# Patient Record
Sex: Female | Born: 1995 | Race: White | Hispanic: No | Marital: Single | Smoking: Never smoker
Health system: Southern US, Community
[De-identification: ages and names within clinical notes are randomized; demographics above are authoritative.]

## PROBLEM LIST (undated history)

## (undated) DIAGNOSIS — F329 Major depressive disorder, single episode, unspecified: Secondary | ICD-10-CM

## (undated) DIAGNOSIS — R519 Headache, unspecified: Secondary | ICD-10-CM

## (undated) DIAGNOSIS — F32A Depression, unspecified: Secondary | ICD-10-CM

## (undated) DIAGNOSIS — R112 Nausea with vomiting, unspecified: Secondary | ICD-10-CM

## (undated) DIAGNOSIS — Z9889 Other specified postprocedural states: Secondary | ICD-10-CM

## (undated) DIAGNOSIS — G4730 Sleep apnea, unspecified: Secondary | ICD-10-CM

## (undated) DIAGNOSIS — O24419 Gestational diabetes mellitus in pregnancy, unspecified control: Secondary | ICD-10-CM

## (undated) DIAGNOSIS — F419 Anxiety disorder, unspecified: Secondary | ICD-10-CM

## (undated) DIAGNOSIS — N12 Tubulo-interstitial nephritis, not specified as acute or chronic: Secondary | ICD-10-CM

## (undated) HISTORY — PX: TONSILLECTOMY: SUR1361

## (undated) HISTORY — PX: ADENOIDECTOMY: SUR15

## (undated) HISTORY — DX: Gestational diabetes mellitus in pregnancy, unspecified control: O24.419

---
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
## 2018-11-16 NOTE — L&D Delivery Note (Signed)
Delivery Note Patient pushed well for 1 hr 20 minutes.  At 5:12 PM a viable female was delivered via Vaginal, Spontaneous (Presentation: Left Occiput Anterior).  APGAR: 8, 9; weight 3625 g (7 lb 15.9 oz)  Placenta status: Spontaneous, Intact.  Cord: 3 vessels with the following complications: None.  Cord pH: n/a  Anesthesia: Epidural Episiotomy: None Lacerations: Vaginal / hymeneal tear Suture Repair: 2.0 vicryl rapide Est. Blood Loss (mL): 373  Mom to postpartum.  Baby to Couplet care / Skin to Skin.  [place] 10/28/2019, 5:41 PM

## 2018-11-20 ENCOUNTER — Encounter (HOSPITAL_COMMUNITY): Payer: Self-pay | Admitting: Emergency Medicine

## 2018-11-20 ENCOUNTER — Ambulatory Visit (HOSPITAL_COMMUNITY)
Admission: EM | Admit: 2018-11-20 | Discharge: 2018-11-20 | Disposition: A | Discharge status: Home / Self Care | Payer: Self-pay | Attending: Family Medicine | Admitting: Family Medicine

## 2018-11-20 DIAGNOSIS — N3001 Acute cystitis with hematuria: Secondary | ICD-10-CM | POA: Insufficient documentation

## 2018-11-20 DIAGNOSIS — R109 Unspecified abdominal pain: Secondary | ICD-10-CM | POA: Insufficient documentation

## 2018-11-20 LAB — POCT URINALYSIS DIP (DEVICE)
Bilirubin Urine: NEGATIVE
Glucose, UA: NEGATIVE mg/dL
Ketones, ur: NEGATIVE mg/dL
NITRITE: NEGATIVE
PH: 7.5 (ref 5.0–8.0)
Protein, ur: 30 mg/dL — AB
Specific Gravity, Urine: 1.02 (ref 1.005–1.030)
UROBILINOGEN UA: 1 mg/dL (ref 0.0–1.0)

## 2018-11-20 LAB — POCT PREGNANCY, URINE: Preg Test, Ur: NEGATIVE

## 2018-11-20 MED ORDER — CIPROFLOXACIN HCL 500 MG PO TABS: 500.0000 mg | ORAL_TABLET | Freq: Two times a day (BID) | ORAL | 0 refills | Status: DC

## 2018-11-20 NOTE — ED Triage Notes (Signed)
Pt states "im pretty sure I had a UTI last week, yesterday and today i've had a fever on and off and i've had pain in my side" pt c/o R flank pain.

## 2018-11-20 NOTE — ED Provider Notes (Signed)
MRN: [id number] DOB: 27-Feb-1996  Subjective:   Carrie Dennis is a 23 y.o. female presenting for 1 week  history of dysuria, urinary frequency, flank pain and cloudy malordorous urine.  Has also had intermittent fever and general malaise, mild hematuria.  Patient is worried about an UTI.   No current facility-administered medications for this encounter.   Current Outpatient Medications:  .  albuterol (PROVENTIL HFA;VENTOLIN HFA) 108 (90 BASE) MCG/ACT inhaler, Inhale 2 puffs into the lungs every 4 (four) hours as needed for wheezing., Disp: 3.7 g, Rfl: 0 .  citalopram (CELEXA) 10 MG tablet, Take 10 mg by mouth daily., Disp: , Rfl:  .  etonogestrel (NEXPLANON) 68 MG IMPL implant, Inject 1 each into the skin once. *implanted birth control, Disp: , Rfl:  .  HYDROcodone-homatropine (HYCODAN) 5-1.5 MG/5ML syrup, Take 5 mLs by mouth every 6 (six) hours as needed for cough. (Patient not taking: Reported on 11/20/2018), Disp: 120 mL, Rfl: 0 .  ibuprofen (ADVIL,MOTRIN) 200 MG tablet, Take 400 mg by mouth every 6 (six) hours as needed. For headaches, Disp: , Rfl:  .  ibuprofen (ADVIL,MOTRIN) 600 MG tablet, Take 1 tablet (600 mg total) by mouth every 6 (six) hours as needed for pain., Disp: 30 tablet, Rfl: 0   Allergies  Allergen Reactions  . Benadryl [Diphenhydramine Hcl] Anaphylaxis  . Penicillins Anaphylaxis    *says only antibiotic agent she can safely take is erythromycin  . Sulfa Antibiotics Anaphylaxis    History reviewed. No pertinent past medical history.   Past Surgical History:  Procedure Laterality Date  . ADENOIDECTOMY    . TONSILLECTOMY      Objective:   Vitals: BP 114/70   Pulse 97   Temp 98 F (36.7 C) (Temporal)   Resp 16   SpO2 100%   Physical Exam Constitutional:      General: She is not in acute distress.    Appearance: Normal appearance. She is well-developed. She is not ill-appearing, toxic-appearing or diaphoretic.  HENT:     Head: Normocephalic and  atraumatic.     Nose: Nose normal.     Mouth/Throat:     Mouth: Mucous membranes are moist.  Eyes:     Extraocular Movements: Extraocular movements intact.     Pupils: Pupils are equal, round, and reactive to light.  Cardiovascular:     Rate and Rhythm: Normal rate and regular rhythm.     Pulses: Normal pulses.     Heart sounds: Normal heart sounds. No murmur. No friction rub. No gallop.   Pulmonary:     Effort: Pulmonary effort is normal. No respiratory distress.     Breath sounds: Normal breath sounds. No stridor. No wheezing, rhonchi or rales.  Skin:    General: Skin is warm and dry.     Findings: No rash.  Neurological:     Mental Status: She is alert and oriented to person, place, and time.  Psychiatric:        Mood and Affect: Mood normal.        Behavior: Behavior normal.        Thought Content: Thought content normal.     Results for orders placed or performed during the hospital encounter of 11/20/18 (from the past 24 hour(s))  POCT urinalysis dip (device)     Status: Abnormal   Collection Time: 11/20/18  6:25 PM  Result Value Ref Range   Glucose, UA NEGATIVE NEGATIVE mg/dL   Bilirubin Urine NEGATIVE NEGATIVE   Ketones, ur NEGATIVE  NEGATIVE mg/dL   Specific Gravity, Urine 1.020 1.005 - 1.030   Hgb urine dipstick SMALL (A) NEGATIVE   pH 7.5 5.0 - 8.0   Protein, ur 30 (A) NEGATIVE mg/dL   Urobilinogen, UA 1.0 0.0 - 1.0 mg/dL   Nitrite NEGATIVE NEGATIVE   Leukocytes, UA LARGE (A) NEGATIVE  Pregnancy, urine POC     Status: None   Collection Time: 11/20/18  6:31 PM  Result Value Ref Range   Preg Test, Ur NEGATIVE NEGATIVE    Assessment and Plan :   Acute cystitis with hematuria  Right flank pain  Patient had right-sided CVA tenderness and I counseled that this could be a sign of acute pyelonephritis but given her significant and severe allergies she refused injection of ceftriaxone.  I did counsel her that the cross-reactivity with cephalosporins and  penicillins has not really been shown to be true.  She was agreeable to using ciprofloxacin, urine culture pending.  Strict ER return to clinic precautions reviewed.  In the meantime patient will hydrate aggressively.   Wallis Bamberg, [place] 11/20/18 1905

## 2018-11-22 ENCOUNTER — Telehealth (HOSPITAL_COMMUNITY): Payer: Self-pay | Admitting: Emergency Medicine

## 2018-11-22 NOTE — Telephone Encounter (Signed)
Attempted to return patients voicemail. No answer. Left Message to return call.

## 2018-11-23 ENCOUNTER — Telehealth (HOSPITAL_COMMUNITY): Payer: Self-pay | Admitting: Emergency Medicine

## 2018-11-23 LAB — URINE CULTURE

## 2018-11-23 NOTE — Telephone Encounter (Signed)
Urine culture was positive for  E coli and was given  cipro at urgent care visit. Attempted to reach patient, patient answered and hung up after I stated who I was.

## 2019-04-17 LAB — OB RESULTS CONSOLE GC/CHLAMYDIA
Chlamydia: NEGATIVE
Gonorrhea: NEGATIVE

## 2019-04-17 LAB — OB RESULTS CONSOLE HEPATITIS B SURFACE ANTIGEN: Hepatitis B Surface Ag: NEGATIVE

## 2019-04-17 LAB — OB RESULTS CONSOLE ABO/RH: RH Type: POSITIVE

## 2019-04-17 LAB — OB RESULTS CONSOLE RPR: RPR: NONREACTIVE

## 2019-04-17 LAB — OB RESULTS CONSOLE RUBELLA ANTIBODY, IGM: Rubella: IMMUNE

## 2019-04-17 LAB — OB RESULTS CONSOLE HIV ANTIBODY (ROUTINE TESTING): HIV: NONREACTIVE

## 2019-04-17 LAB — OB RESULTS CONSOLE ANTIBODY SCREEN: Antibody Screen: NEGATIVE

## 2019-09-22 ENCOUNTER — Inpatient Hospital Stay (HOSPITAL_COMMUNITY)
Admission: AD | Admit: 2019-09-22 | Discharge: 2019-09-22 | Disposition: A | Discharge status: Home / Self Care | Payer: Medicaid Other | Attending: Obstetrics and Gynecology | Admitting: Obstetrics and Gynecology

## 2019-09-22 ENCOUNTER — Encounter (HOSPITAL_COMMUNITY): Payer: Self-pay

## 2019-09-22 ENCOUNTER — Other Ambulatory Visit: Payer: Self-pay

## 2019-09-22 DIAGNOSIS — F329 Major depressive disorder, single episode, unspecified: Secondary | ICD-10-CM | POA: Diagnosis not present

## 2019-09-22 DIAGNOSIS — O26893 Other specified pregnancy related conditions, third trimester: Secondary | ICD-10-CM | POA: Insufficient documentation

## 2019-09-22 DIAGNOSIS — O99343 Other mental disorders complicating pregnancy, third trimester: Secondary | ICD-10-CM | POA: Insufficient documentation

## 2019-09-22 DIAGNOSIS — Z79899 Other long term (current) drug therapy: Secondary | ICD-10-CM | POA: Diagnosis not present

## 2019-09-22 DIAGNOSIS — R109 Unspecified abdominal pain: Secondary | ICD-10-CM | POA: Insufficient documentation

## 2019-09-22 DIAGNOSIS — M549 Dorsalgia, unspecified: Secondary | ICD-10-CM | POA: Insufficient documentation

## 2019-09-22 DIAGNOSIS — Z3A35 35 weeks gestation of pregnancy: Secondary | ICD-10-CM | POA: Insufficient documentation

## 2019-09-22 DIAGNOSIS — F419 Anxiety disorder, unspecified: Secondary | ICD-10-CM | POA: Diagnosis not present

## 2019-09-22 DIAGNOSIS — O4703 False labor before 37 completed weeks of gestation, third trimester: Secondary | ICD-10-CM | POA: Insufficient documentation

## 2019-09-22 DIAGNOSIS — O479 False labor, unspecified: Secondary | ICD-10-CM

## 2019-09-22 HISTORY — DX: Other specified postprocedural states: Z98.890

## 2019-09-22 HISTORY — DX: Anxiety disorder, unspecified: F41.9

## 2019-09-22 HISTORY — DX: Sleep apnea, unspecified: G47.30

## 2019-09-22 HISTORY — DX: Depression, unspecified: F32.A

## 2019-09-22 HISTORY — DX: Nausea with vomiting, unspecified: R11.2

## 2019-09-22 HISTORY — DX: Headache, unspecified: R51.9

## 2019-09-22 LAB — URINALYSIS, ROUTINE W REFLEX MICROSCOPIC
Bilirubin Urine: NEGATIVE
Glucose, UA: NEGATIVE mg/dL
Hgb urine dipstick: NEGATIVE
Ketones, ur: NEGATIVE mg/dL
Leukocytes,Ua: NEGATIVE
Nitrite: NEGATIVE
Protein, ur: NEGATIVE mg/dL
Specific Gravity, Urine: 1.013 (ref 1.005–1.030)
pH: 7 (ref 5.0–8.0)

## 2019-09-22 NOTE — MAU Note (Signed)
.  Carrie Dennis is a 23 y.o. at [redacted]w[redacted]d here in MAU reporting: ctx that started x2 weeks ago. She reports that they slowed down, but today she has had sharp pains in her pelvis and right side accompanied by abdominal cramping that began last night. No VB or LOF. Patient reports she feels fetal movement.   Pain score: 6  FHT: 140 Lab orders placed from triage:

## 2019-09-22 NOTE — Discharge Instructions (Signed)
Fetal Movement Counts Patient Name: ________________________________________________ Patient Due Date: ____________________ What is a fetal movement count?  A fetal movement count is the number of times that you feel your baby move during a certain amount of time. This may also be called a fetal kick count. A fetal movement count is recommended for every pregnant woman. You may be asked to start counting fetal movements as early as week 28 of your pregnancy. Pay attention to when your baby is most active. You may notice your baby's sleep and wake cycles. You may also notice things that make your baby move more. You should do a fetal movement count:  When your baby is normally most active.  At the same time each day. A good time to count movements is while you are resting, after having something to eat and drink. How do I count fetal movements? 1. Find a quiet, comfortable area. Sit, or lie down on your side. 2. Write down the date, the start time and stop time, and the number of movements that you felt between those two times. Take this information with you to your health care visits. 3. For 2 hours, count kicks, flutters, swishes, rolls, and jabs. You should feel at least 10 movements during 2 hours. 4. You may stop counting after you have felt 10 movements. 5. If you do not feel 10 movements in 2 hours, have something to eat and drink. Then, keep resting and counting for 1 hour. If you feel at least 4 movements during that hour, you may stop counting. Contact a health care provider if:  You feel fewer than 4 movements in 2 hours.  Your baby is not moving like he or she usually does. Date: ____________ Start time: ____________ Stop time: ____________ Movements: ____________ Date: ____________ Start time: ____________ Stop time: ____________ Movements: ____________ Date: ____________ Start time: ____________ Stop time: ____________ Movements: ____________ Date: ____________ Start time:  ____________ Stop time: ____________ Movements: ____________ Date: ____________ Start time: ____________ Stop time: ____________ Movements: ____________ Date: ____________ Start time: ____________ Stop time: ____________ Movements: ____________ Date: ____________ Start time: ____________ Stop time: ____________ Movements: ____________ Date: ____________ Start time: ____________ Stop time: ____________ Movements: ____________ Date: ____________ Start time: ____________ Stop time: ____________ Movements: ____________ This information is not intended to replace advice given to you by your health care provider. Make sure you discuss any questions you have with your health care provider. Document Released: 12/02/2006 Document Revised: 11/22/2018 Document Reviewed: 12/12/2015 Elsevier Patient Education  2020 [employer] Signs and Symptoms of Labor Labor is your body's natural process of moving your baby, placenta, and umbilical cord out of your uterus. The process of labor usually starts when your baby is full-term, between 37 and 40 weeks of pregnancy. How will I know when I am close to going into labor? As your body prepares for labor and the birth of your baby, you may notice the following symptoms in the weeks and days before true labor starts:  Having a strong desire to get your home ready to receive your new baby. This is called nesting. Nesting may be a sign that labor is approaching, and it may occur several weeks before birth. Nesting may involve cleaning and organizing your home.  Passing a small amount of thick, bloody mucus out of your vagina (normal bloody show or losing your mucus plug). This may happen more than a week before labor begins, or it might occur right before labor begins as the opening of the cervix starts   to widen (dilate). For some women, the entire mucus plug passes at once. For others, smaller portions of the mucus plug may gradually pass over several days.  Your baby  moving (dropping) lower in your pelvis to get into position for birth (lightening). When this happens, you may feel more pressure on your bladder and pelvic bone and less pressure on your ribs. This may make it easier to breathe. It may also cause you to need to urinate more often and have problems with bowel movements.  Having "practice contractions" (Braxton Hicks contractions) that occur at irregular (unevenly spaced) intervals that are more than 10 minutes apart. This is also called false labor. False labor contractions are common after exercise or sexual activity, and they will stop if you change position, rest, or drink fluids. These contractions are usually mild and do not get stronger over time. They may feel like: ? A backache or back pain. ? Mild cramps, similar to menstrual cramps. ? Tightening or pressure in your abdomen. Other early symptoms that labor may be starting soon include:  Nausea or loss of appetite.  Diarrhea.  Having a sudden burst of energy, or feeling very tired.  Mood changes.  Having trouble sleeping. How will I know when labor has begun? Signs that true labor has begun may include:  Having contractions that come at regular (evenly spaced) intervals and increase in intensity. This may feel like more intense tightening or pressure in your abdomen that moves to your back. ? Contractions may also feel like rhythmic pain in your upper thighs or back that comes and goes at regular intervals. ? For first-time mothers, this change in intensity of contractions often occurs at a more gradual pace. ? Women who have given birth before may notice a more rapid progression of contraction changes.  Having a feeling of pressure in the vaginal area.  Your water breaking (rupture of membranes). This is when the sac of fluid that surrounds your baby breaks. When this happens, you will notice fluid leaking from your vagina. This may be clear or blood-tinged. Labor usually starts  within 24 hours of your water breaking, but it may take longer to begin. ? Some women notice this as a gush of fluid. ? Others notice that their underwear repeatedly becomes damp. Follow these instructions at home:   When labor starts, or if your water breaks, call your health care provider or nurse care line. Based on your situation, they will determine when you should go in for an exam.  When you are in early labor, you may be able to rest and manage symptoms at home. Some strategies to try at home include: ? Breathing and relaxation techniques. ? Taking a warm bath or shower. ? Listening to music. ? Using a heating pad on the lower back for pain. If you are directed to use heat:  Place a towel between your skin and the heat source.  Leave the heat on for 20-30 minutes.  Remove the heat if your skin turns bright red. This is especially important if you are unable to feel pain, heat, or cold. You may have a greater risk of getting burned. Get help right away if:  You have painful, regular contractions that are 5 minutes apart or less.  Labor starts before you are [redacted] weeks along in your pregnancy.  You have a fever.  You have a headache that does not go away.  You have bright red blood coming from your vagina.  You   do not feel your baby moving.  You have a sudden onset of: ? Severe headache with vision problems. ? Nausea, vomiting, or diarrhea. ? Chest pain or shortness of breath. These symptoms may be an emergency. If your health care provider recommends that you go to the hospital or birth center where you plan to deliver, do not drive yourself. Have someone else drive you, or call emergency services (911 in the [place] Summary  Labor is your body's natural process of moving your baby, placenta, and umbilical cord out of your uterus.  The process of labor usually starts when your baby is full-term, between 37 and 40 weeks of pregnancy.  When labor starts, or if your water  breaks, call your health care provider or nurse care line. Based on your situation, they will determine when you should go in for an exam. This information is not intended to replace advice given to you by your health care provider. Make sure you discuss any questions you have with your health care provider. Document Released: 04/09/2017 Document Revised: 08/02/2017 Document Reviewed: 04/09/2017 Elsevier Patient Education  2020 [employer]  

## 2019-09-22 NOTE — MAU Provider Note (Signed)
Chief Complaint:  Contractions   First Provider Initiated Contact with Patient 09/22/19 2154     HPI: Carrie Dennis is a 23 y.o. G1P0 at [redacted]w[redacted]d who presents to maternity admissions reporting contractions. Has had intermittent contractions for the last 2 weeks that were worse this evening. Prior to coming to MAU felt 6 painful contractions that lasted about 15 seconds each. Since then has had some menstrual like cramping and a sore low back. Denies LOF or vaginal bleeding. Good fetal movement.   Location: abdomen Quality: sharp, cramping Severity: 6/10 in pain scale Duration: 2 weeks Timing: intermittent Modifying factors: none Associated signs and symptoms: none  Pregnancy Course: [facility] ob[facility]. Denies complications. Next appt on Monday  Past Medical History:  Diagnosis Date  . Anxiety   . Depression   . Headache   . PONV (postoperative nausea and vomiting)   . Sleep apnea    OB History  Gravida Para Term Preterm AB Living  1            SAB TAB Ectopic Multiple Live Births               # Outcome Date GA Lbr Len/2nd Weight Sex Delivery Anes PTL Lv  1 Current            Past Surgical History:  Procedure Laterality Date  . ADENOIDECTOMY    . TONSILLECTOMY     Family History  Problem Relation Age of Onset  . Cancer Mother   . COPD Maternal Grandmother   . Heart disease Maternal Grandfather   . Stroke Maternal Grandfather    Social History   Tobacco Use  . Smoking status: Never Smoker  . Smokeless tobacco: Never Used  Substance Use Topics  . Alcohol use: No  . Drug use: Yes   Allergies  Allergen Reactions  . Benadryl [Diphenhydramine Hcl] Anaphylaxis  . Penicillins Anaphylaxis    *says only antibiotic agent she can safely take is erythromycin  . Sulfa Antibiotics Anaphylaxis   Medications Prior to Admission  Medication Sig Dispense Refill Last Dose  . famotidine (PEPCID) 20 MG tablet Take 20 mg by mouth 2 (two) times daily.   09/22/2019 at Unknown  time  . ferrous sulfate 325 (65 FE) MG tablet Take 325 mg by mouth daily with breakfast.   09/22/2019 at Unknown time  . Prenatal Vit-Fe Fumarate-FA (PRENATAL MULTIVITAMIN) TABS tablet Take 1 tablet by mouth daily at 12 noon.   09/22/2019 at Unknown time  . albuterol (PROVENTIL HFA;VENTOLIN HFA) 108 (90 BASE) MCG/ACT inhaler Inhale 2 puffs into the lungs every 4 (four) hours as needed for wheezing. 3.7 g 0   . ciprofloxacin (CIPRO) 500 MG tablet Take 1 tablet (500 mg total) by mouth 2 (two) times daily. 20 tablet 0   . citalopram (CELEXA) 10 MG tablet Take 10 mg by mouth daily.     Marland Kitchen etonogestrel (NEXPLANON) 68 MG IMPL implant Inject 1 each into the skin once. *implanted birth control     . HYDROcodone-homatropine (HYCODAN) 5-1.5 MG/5ML syrup Take 5 mLs by mouth every 6 (six) hours as needed for cough. (Patient not taking: Reported on 11/20/2018) 120 mL 0   . ibuprofen (ADVIL,MOTRIN) 200 MG tablet Take 400 mg by mouth every 6 (six) hours as needed. For headaches     . ibuprofen (ADVIL,MOTRIN) 600 MG tablet Take 1 tablet (600 mg total) by mouth every 6 (six) hours as needed for pain. 30 tablet 0     I have  reviewed patient's Past Medical Hx, Surgical Hx, Family Hx, Social Hx, medications and allergies.   ROS:  Review of Systems  Constitutional: Negative.   Gastrointestinal: Positive for abdominal pain.  Genitourinary: Negative.   Musculoskeletal: Positive for back pain.    Physical Exam   Patient Vitals for the past 24 hrs:  BP Temp Temp src Pulse Resp SpO2 Height Weight  09/22/19 2120 109/71 98.1 F (36.7 C) Oral 98 14 99 % 5\' 2"  (1.575 m) 83.5 kg    Constitutional: Well-developed, well-nourished female in no acute distress.  Cardiovascular: normal rate & rhythm, no murmur Respiratory: normal effort, lung sounds clear throughout GI: Abd soft, non-tender, gravid appropriate for gestational age. Pos BS x 4 MS: Extremities nontender, no edema, normal ROM Neurologic: Alert and oriented x  4.  GU:   Dilation: Closed Effacement (%): Thick Cervical Position: Posterior Exam by:: Judeth HornErin Ajna Moors NP  NST:  Baseline: 125 bpm, Variability: Good {> 6 bpm), Accelerations: Reactive and Decelerations: Absent   Labs: Results for orders placed or performed during the hospital encounter of 09/22/19 (from the past 24 hour(s))  Urinalysis, Routine w reflex microscopic     Status: Abnormal   Collection Time: 09/22/19  9:36 PM  Result Value Ref Range   Color, Urine YELLOW YELLOW   APPearance HAZY (A) CLEAR   Specific Gravity, Urine 1.013 1.005 - 1.030   pH 7.0 5.0 - 8.0   Glucose, UA NEGATIVE NEGATIVE mg/dL   Hgb urine dipstick NEGATIVE NEGATIVE   Bilirubin Urine NEGATIVE NEGATIVE   Ketones, ur NEGATIVE NEGATIVE mg/dL   Protein, ur NEGATIVE NEGATIVE mg/dL   Nitrite NEGATIVE NEGATIVE   Leukocytes,Ua NEGATIVE NEGATIVE    Imaging:  No results found.  MAU Course: Orders Placed This Encounter  Procedures  . Urinalysis, Routine w reflex microscopic  . Discharge patient   No orders of the defined types were placed in this encounter.   MDM: Reactive fetal tracing No ctx Cervix closed/thick U/a negative for signs of infection or dehydration Assessment: 1. Braxton Hick's contraction   2. [redacted] weeks gestation of pregnancy     Plan: Discharge home in stable condition.  Preterm Labor precautions and fetal kick counts Follow-up Information    [facility]   Why: keep scheduled appointment or call office as needed Contact information: [address] [place] [id number] [phone]        [facility] Follow up.   Specialty: Obstetrics and Gynecology Why: return for worsening symptoms Contact information: [address] [id number] [place] [id number] [phone]          Allergies as of 09/22/2019      Reactions   Benadryl [diphenhydramine Hcl] Anaphylaxis   Penicillins Anaphylaxis    *says only antibiotic agent she can safely take is erythromycin   Sulfa Antibiotics Anaphylaxis      Medication List    STOP taking these medications   albuterol 108 (90 Base) MCG/ACT inhaler Commonly known as: VENTOLIN HFA   ciprofloxacin 500 MG tablet Commonly known as: Cipro   citalopram 10 MG tablet Commonly known as: CELEXA   HYDROcodone-homatropine 5-1.5 MG/5ML syrup Commonly known as: HYCODAN   ibuprofen 200 MG tablet Commonly known as: ADVIL   ibuprofen 600 MG tablet Commonly known as: ADVIL   Nexplanon 68 MG Impl implant Generic drug: etonogestrel     TAKE these medications   famotidine 20 MG tablet Commonly known as: PEPCID Take 20  mg by mouth 2 (two) times daily.   ferrous sulfate 325 (65 FE) MG tablet Take 325 mg by mouth daily with breakfast.   prenatal multivitamin Tabs tablet Take 1 tablet by mouth daily at 12 noon.       Jorje Guild, NP 09/22/2019 10:21 PM

## 2019-10-02 LAB — OB RESULTS CONSOLE GBS: GBS: NEGATIVE

## 2019-10-25 ENCOUNTER — Encounter (HOSPITAL_COMMUNITY): Payer: Self-pay | Admitting: *Deleted

## 2019-10-25 ENCOUNTER — Other Ambulatory Visit: Payer: Self-pay | Admitting: Obstetrics and Gynecology

## 2019-10-25 ENCOUNTER — Telehealth (HOSPITAL_COMMUNITY): Payer: Self-pay | Admitting: *Deleted

## 2019-10-25 NOTE — Telephone Encounter (Signed)
Preadmission screen  

## 2019-10-28 ENCOUNTER — Inpatient Hospital Stay (HOSPITAL_COMMUNITY)
Admission: AD | Admit: 2019-10-28 | Discharge: 2019-10-30 | DRG: 807 | Disposition: A | Discharge status: Home / Self Care | Payer: Medicaid Other | Attending: Obstetrics | Admitting: Obstetrics

## 2019-10-28 ENCOUNTER — Other Ambulatory Visit: Payer: Self-pay

## 2019-10-28 ENCOUNTER — Inpatient Hospital Stay (HOSPITAL_COMMUNITY): Payer: Medicaid Other | Admitting: Anesthesiology

## 2019-10-28 ENCOUNTER — Encounter (HOSPITAL_COMMUNITY): Payer: Self-pay | Admitting: Obstetrics and Gynecology

## 2019-10-28 DIAGNOSIS — Z3A40 40 weeks gestation of pregnancy: Secondary | ICD-10-CM

## 2019-10-28 DIAGNOSIS — O480 Post-term pregnancy: Principal | ICD-10-CM | POA: Diagnosis present

## 2019-10-28 DIAGNOSIS — Z3689 Encounter for other specified antenatal screening: Secondary | ICD-10-CM

## 2019-10-28 DIAGNOSIS — O4290 Premature rupture of membranes, unspecified as to length of time between rupture and onset of labor, unspecified weeks of gestation: Secondary | ICD-10-CM | POA: Diagnosis present

## 2019-10-28 DIAGNOSIS — O26893 Other specified pregnancy related conditions, third trimester: Secondary | ICD-10-CM | POA: Diagnosis present

## 2019-10-28 DIAGNOSIS — Z20828 Contact with and (suspected) exposure to other viral communicable diseases: Secondary | ICD-10-CM | POA: Diagnosis present

## 2019-10-28 LAB — CBC
HCT: 36.8 % (ref 36.0–46.0)
Hemoglobin: 12.1 g/dL (ref 12.0–15.0)
MCH: 27.9 pg (ref 26.0–34.0)
MCHC: 32.9 g/dL (ref 30.0–36.0)
MCV: 85 fL (ref 80.0–100.0)
Platelets: 371 10*3/uL (ref 150–400)
RBC: 4.33 MIL/uL (ref 3.87–5.11)
RDW: 14.9 % (ref 11.5–15.5)
WBC: 20.9 10*3/uL — ABNORMAL HIGH (ref 4.0–10.5)
nRBC: 0 % (ref 0.0–0.2)

## 2019-10-28 LAB — ABO/RH: ABO/RH(D): O POS

## 2019-10-28 LAB — POCT FERN TEST
POCT Fern Test: POSITIVE
POCT Fern Test: POSITIVE

## 2019-10-28 LAB — RPR: RPR Ser Ql: NONREACTIVE

## 2019-10-28 LAB — TYPE AND SCREEN
ABO/RH(D): O POS
Antibody Screen: NEGATIVE

## 2019-10-28 LAB — SARS CORONAVIRUS 2 (TAT 6-24 HRS): SARS Coronavirus 2: NEGATIVE

## 2019-10-28 MED ORDER — OXYCODONE-ACETAMINOPHEN 5-325 MG PO TABS: 1.0000 | ORAL_TABLET | ORAL | Status: DC | PRN

## 2019-10-28 MED ORDER — SENNOSIDES-DOCUSATE SODIUM 8.6-50 MG PO TABS
2.0000 | ORAL_TABLET | ORAL | Status: DC
  Administered 2019-10-29: 01:00:00 2 via ORAL
  Filled 2019-10-28: qty 2

## 2019-10-28 MED ORDER — OXYCODONE HCL 5 MG PO TABS: 10.0000 mg | ORAL_TABLET | ORAL | Status: DC | PRN

## 2019-10-28 MED ORDER — ACETAMINOPHEN 325 MG PO TABS: 650.0000 mg | ORAL_TABLET | ORAL | Status: DC | PRN

## 2019-10-28 MED ORDER — PHENYLEPHRINE 40 MCG/ML (10ML) SYRINGE FOR IV PUSH (FOR BLOOD PRESSURE SUPPORT): 80.0000 ug | PREFILLED_SYRINGE | INTRAVENOUS | Status: DC | PRN

## 2019-10-28 MED ORDER — LACTATED RINGERS IV SOLN
500.0000 mL | Freq: Once | INTRAVENOUS | Status: AC
  Administered 2019-10-28: 500 mL via INTRAVENOUS

## 2019-10-28 MED ORDER — LACTATED RINGERS IV SOLN
500.0000 mL | INTRAVENOUS | Status: DC | PRN
  Administered 2019-10-28: 06:00:00 300 mL via INTRAVENOUS

## 2019-10-28 MED ORDER — FENTANYL CITRATE (PF) 100 MCG/2ML IJ SOLN
50.0000 ug | INTRAMUSCULAR | Status: DC | PRN
  Administered 2019-10-28 (×2): 100 ug via INTRAVENOUS
  Filled 2019-10-28 (×2): qty 2

## 2019-10-28 MED ORDER — FENTANYL-BUPIVACAINE-NACL 0.5-0.125-0.9 MG/250ML-% EP SOLN
EPIDURAL | Status: AC
  Filled 2019-10-28: qty 250

## 2019-10-28 MED ORDER — ACETAMINOPHEN 325 MG PO TABS
650.0000 mg | ORAL_TABLET | ORAL | Status: DC | PRN
  Administered 2019-10-29 – 2019-10-30 (×2): 650 mg via ORAL
  Filled 2019-10-28 (×2): qty 2

## 2019-10-28 MED ORDER — EPHEDRINE 5 MG/ML INJ: 10.0000 mg | INTRAVENOUS | Status: DC | PRN

## 2019-10-28 MED ORDER — WITCH HAZEL-GLYCERIN EX PADS: 1.0000 "application " | MEDICATED_PAD | CUTANEOUS | Status: DC | PRN

## 2019-10-28 MED ORDER — COCONUT OIL OIL: 1.0000 "application " | TOPICAL_OIL | Status: DC | PRN

## 2019-10-28 MED ORDER — DIBUCAINE (PERIANAL) 1 % EX OINT: 1.0000 "application " | TOPICAL_OINTMENT | CUTANEOUS | Status: DC | PRN

## 2019-10-28 MED ORDER — TERBUTALINE SULFATE 1 MG/ML IJ SOLN: 0.2500 mg | Freq: Once | INTRAMUSCULAR | Status: DC | PRN

## 2019-10-28 MED ORDER — OXYTOCIN BOLUS FROM INFUSION
500.0000 mL | Freq: Once | INTRAVENOUS | Status: AC
  Administered 2019-10-28: 500 mL via INTRAVENOUS

## 2019-10-28 MED ORDER — SOD CITRATE-CITRIC ACID 500-334 MG/5ML PO SOLN: 30.0000 mL | ORAL | Status: DC | PRN

## 2019-10-28 MED ORDER — FAMOTIDINE 20 MG PO TABS
20.0000 mg | ORAL_TABLET | Freq: Every day | ORAL | Status: DC
  Administered 2019-10-28: 07:00:00 20 mg via ORAL
  Filled 2019-10-28: qty 1

## 2019-10-28 MED ORDER — OXYCODONE-ACETAMINOPHEN 5-325 MG PO TABS: 2.0000 | ORAL_TABLET | ORAL | Status: DC | PRN

## 2019-10-28 MED ORDER — OXYTOCIN 40 UNITS IN NORMAL SALINE INFUSION - SIMPLE MED
1.0000 m[IU]/min | INTRAVENOUS | Status: DC
  Administered 2019-10-28: 2 m[IU]/min via INTRAVENOUS

## 2019-10-28 MED ORDER — LIDOCAINE HCL (PF) 1 % IJ SOLN
30.0000 mL | INTRAMUSCULAR | Status: AC | PRN
  Administered 2019-10-28 (×2): 6 mL via SUBCUTANEOUS

## 2019-10-28 MED ORDER — BENZOCAINE-MENTHOL 20-0.5 % EX AERO
1.0000 "application " | INHALATION_SPRAY | CUTANEOUS | Status: DC | PRN
  Administered 2019-10-28 – 2019-10-30 (×2): 1 via TOPICAL
  Filled 2019-10-28 (×2): qty 56

## 2019-10-28 MED ORDER — IBUPROFEN 600 MG PO TABS
600.0000 mg | ORAL_TABLET | Freq: Four times a day (QID) | ORAL | Status: DC
  Administered 2019-10-28 – 2019-10-30 (×8): 600 mg via ORAL
  Filled 2019-10-28 (×8): qty 1

## 2019-10-28 MED ORDER — TETANUS-DIPHTH-ACELL PERTUSSIS 5-2.5-18.5 LF-MCG/0.5 IM SUSP: 0.5000 mL | Freq: Once | INTRAMUSCULAR | Status: DC

## 2019-10-28 MED ORDER — OXYTOCIN 40 UNITS IN NORMAL SALINE INFUSION - SIMPLE MED
2.5000 [IU]/h | INTRAVENOUS | Status: DC
  Filled 2019-10-28: qty 1000

## 2019-10-28 MED ORDER — SODIUM CHLORIDE (PF) 0.9 % IJ SOLN
INTRAMUSCULAR | Status: DC | PRN
  Administered 2019-10-28: 12 mL/h via EPIDURAL

## 2019-10-28 MED ORDER — SIMETHICONE 80 MG PO CHEW: 80.0000 mg | CHEWABLE_TABLET | ORAL | Status: DC | PRN

## 2019-10-28 MED ORDER — LIDOCAINE HCL (PF) 1 % IJ SOLN
INTRAMUSCULAR | Status: AC
  Administered 2019-10-28: 30 mL via SUBCUTANEOUS
  Filled 2019-10-28: qty 30

## 2019-10-28 MED ORDER — ONDANSETRON HCL 4 MG PO TABS: 4.0000 mg | ORAL_TABLET | ORAL | Status: DC | PRN

## 2019-10-28 MED ORDER — ONDANSETRON HCL 4 MG/2ML IJ SOLN: 4.0000 mg | INTRAMUSCULAR | Status: DC | PRN

## 2019-10-28 MED ORDER — OXYCODONE HCL 5 MG PO TABS: 5.0000 mg | ORAL_TABLET | ORAL | Status: DC | PRN

## 2019-10-28 MED ORDER — LACTATED RINGERS IV SOLN
INTRAVENOUS | Status: DC
  Administered 2019-10-28 (×2): via INTRAVENOUS

## 2019-10-28 MED ORDER — ONDANSETRON HCL 4 MG/2ML IJ SOLN: 4.0000 mg | Freq: Four times a day (QID) | INTRAMUSCULAR | Status: DC | PRN

## 2019-10-28 MED ORDER — PRENATAL MULTIVITAMIN CH
1.0000 | ORAL_TABLET | Freq: Every day | ORAL | Status: DC
  Administered 2019-10-29 – 2019-10-30 (×2): 1 via ORAL
  Filled 2019-10-28 (×2): qty 1

## 2019-10-28 MED ORDER — FAMOTIDINE 20 MG PO TABS
20.0000 mg | ORAL_TABLET | Freq: Two times a day (BID) | ORAL | Status: DC
  Filled 2019-10-28 (×2): qty 1

## 2019-10-28 MED ORDER — FENTANYL-BUPIVACAINE-NACL 0.5-0.125-0.9 MG/250ML-% EP SOLN: 12.0000 mL/h | EPIDURAL | Status: DC | PRN

## 2019-10-28 NOTE — Progress Notes (Signed)
Mom and baby report given

## 2019-10-28 NOTE — MAU Note (Signed)
Pt reports to MAU c/o SROM @ 2303 brownish fluid. No FM since SROM but +FM earlier in the day. Pt reports some vaginal spotting around 2100. Ctx every 3-5 min.

## 2019-10-28 NOTE — Anesthesia Preprocedure Evaluation (Signed)
Anesthesia Evaluation  Patient identified by MRN, date of birth, ID band Patient awake    Reviewed: Allergy & Precautions, H&P , NPO status , Patient's Chart, lab work & pertinent test results  History of Anesthesia Complications (+) PONV and history of anesthetic complications  Airway Mallampati: I  TM Distance: >3 FB Neck ROM: full    Dental no notable dental hx. (+) Teeth Intact   Pulmonary    Pulmonary exam normal breath sounds clear to auscultation       Cardiovascular negative cardio ROS Normal cardiovascular exam Rhythm:regular Rate:Normal     Neuro/Psych    GI/Hepatic Neg liver ROS,   Endo/Other  negative endocrine ROS  Renal/GU negative Renal ROS  negative genitourinary   Musculoskeletal negative musculoskeletal ROS (+)   Abdominal (+) + obese,   Peds  Hematology negative hematology ROS (+)   Anesthesia Other Findings   Reproductive/Obstetrics (+) Pregnancy                             Anesthesia Physical Anesthesia Plan  ASA: II  Anesthesia Plan: Epidural   Post-op Pain Management:    Induction:   PONV Risk Score and Plan:   Airway Management Planned:   Additional Equipment:   Intra-op Plan:   Post-operative Plan:   Informed Consent: I have reviewed the patients History and Physical, chart, labs and discussed the procedure including the risks, benefits and alternatives for the proposed anesthesia with the patient or authorized representative who has indicated his/her understanding and acceptance.       Plan Discussed with:   Anesthesia Plan Comments:         Anesthesia Quick Evaluation

## 2019-10-28 NOTE — H&P (Signed)
23 y.o. G2P0010 @ [redacted]w[redacted]d presents with leakage of fluid yesterday evening at 2300.  She was ruled in for rupture with a + fern.  She was contracting regularly on admission.  Otherwise has good fetal movement and no bleeding.  Past Medical History:  Diagnosis Date  . Anxiety   . Depression   . Headache   . PONV (postoperative nausea and vomiting)   . Sleep apnea     Past Surgical History:  Procedure Laterality Date  . ADENOIDECTOMY    . TONSILLECTOMY      OB History  Gravida Para Term Preterm AB Living  1            SAB TAB Ectopic Multiple Live Births               # Outcome Date GA Lbr Len/2nd Weight Sex Delivery Anes PTL Lv  1 Current             Social History   Socioeconomic History  . Marital status: Single    Spouse name: Not on file  . Number of children: Not on file  . Years of education: Not on file  . Highest education level: Not on file  Occupational History  . Not on file  Tobacco Use  . Smoking status: Never Smoker  . Smokeless tobacco: Never Used  Substance and Sexual Activity  . Alcohol use: No  . Drug use: Yes    Comment: TCH Pen  . Sexual activity: Yes  Other Topics Concern  . Not on file  Social History Narrative  . Not on file     Benadryl [diphenhydramine hcl], Penicillins, and Sulfa antibiotics    Prenatal Transfer Tool  Maternal Diabetes: No Genetic Screening: Normal, low risk NIPT Maternal Ultrasounds/Referrals: Normal Fetal Ultrasounds or other Referrals:  None Maternal Substance Abuse:  No Significant Maternal Medications:  Meds include: Other: pepcid Significant Maternal Lab Results: Group B Strep negative  ABO, Rh: --/--/O POS, O POS Performed at [facility], [place] [address], [place], [place] [address]  [phone]) Antibody: NEG (12/12 0138) Rubella: Immune (06/01 0000) RPR: Nonreactive (06/01 0000)  HBsAg: Negative (06/01 0000)  HIV: Non-reactive (06/01 0000)  GBS: Negative/-- (11/16 0000)     Other PNC:  uncomplicated.    Vitals:   10/28/19 0551 10/28/19 0601  BP: 119/67 129/80  Pulse: (!) 102 (!) 103  Resp: 16   Temp:    SpO2:       General:  NAD Abdomen:  soft, gravid, EFW 7.5-8# Ex:  trace edema SVE:  5/70/-2 FHTs:  130s, moderate variability, + accels Toco: not tracing well  Growth [place] on 10/24/19:  EFW 7lb 12oz (56%)  A/P   23 y.o. G2P0010 [redacted]w[redacted]d presents with rupture of membranes IUPC placed to better monitor contractions.  Likely still in latent labor.  Will augment with pitocing as needed  FSR/ vtx/ GBS negative  [place]

## 2019-10-28 NOTE — MAU Provider Note (Signed)
History   [id number]   Chief Complaint  Patient presents with  . Contractions  . Decreased Fetal Movement  . Rupture of Membranes    HPI Carrie Dennis is a 23 y.o. female  G1P0 @40 .3 wks here with report of gush of brown watery fluid around 2300.  Leaking of fluid has continued. Pt reports regular contractions q5 min since LOF. She denies vaginal bleeding. Last intercourse was not recent. She reports good fetal movement. All other systems negative.    No LMP recorded. Patient is pregnant.  OB History  Gravida Para Term Preterm AB Living  1            SAB TAB Ectopic Multiple Live Births               # Outcome Date GA Lbr Len/2nd Weight Sex Delivery Anes PTL Lv  1 Current             Past Medical History:  Diagnosis Date  . Anxiety   . Depression   . Headache   . PONV (postoperative nausea and vomiting)   . Sleep apnea     Family History  Problem Relation Age of Onset  . Cancer Mother   . COPD Maternal Grandmother   . Heart disease Maternal Grandfather   . Stroke Maternal Grandfather     Social History   Socioeconomic History  . Marital status: Single    Spouse name: Not on file  . Number of children: Not on file  . Years of education: Not on file  . Highest education level: Not on file  Occupational History  . Not on file  Tobacco Use  . Smoking status: Never Smoker  . Smokeless tobacco: Never Used  Substance and Sexual Activity  . Alcohol use: No  . Drug use: Yes    Comment: TCH Pen  . Sexual activity: Yes  Other Topics Concern  . Not on file  Social History Narrative  . Not on file   Social Determinants of Health   Financial Resource Strain:   . Difficulty of Paying Living Expenses: Not on file  Food Insecurity:   . Worried About Charity fundraiser in the Last Year: Not on file  . Ran Out of Food in the Last Year: Not on file  Transportation Needs:   . Lack of Transportation (Medical): Not on file  . Lack of Transportation  (Non-Medical): Not on file  Physical Activity:   . Days of Exercise per Week: Not on file  . Minutes of Exercise per Session: Not on file  Stress:   . Feeling of Stress : Not on file  Social Connections:   . Frequency of Communication with Friends and Family: Not on file  . Frequency of Social Gatherings with Friends and Family: Not on file  . Attends Religious Services: Not on file  . Active Member of Clubs or Organizations: Not on file  . Attends Archivist Meetings: Not on file  . Marital Status: Not on file    Allergies  Allergen Reactions  . Benadryl [Diphenhydramine Hcl] Anaphylaxis  . Penicillins Anaphylaxis    *says only antibiotic agent she can safely take is erythromycin  . Sulfa Antibiotics Anaphylaxis    No current facility-administered medications on file prior to encounter.   Current Outpatient Medications on File Prior to Encounter  Medication Sig Dispense Refill  . famotidine (PEPCID) 20 MG tablet Take 20 mg by mouth 2 (two) times daily.    Marland Kitchen  ferrous sulfate 325 (65 FE) MG tablet Take 325 mg by mouth daily with breakfast.    . Prenatal Vit-Fe Fumarate-FA (PRENATAL MULTIVITAMIN) TABS tablet Take 1 tablet by mouth daily at 12 noon.       Review of Systems  Gastrointestinal: Positive for abdominal pain.  Genitourinary: Positive for vaginal discharge. Negative for vaginal bleeding.     Physical Exam   Vitals:   10/28/19 0014 10/28/19 0018  BP:  113/74  Pulse:  100  Resp:  19  Temp:  97.9 F (36.6 C)  TempSrc:  Oral  Weight: 86.9 kg     Physical Exam  Nursing note and vitals reviewed. Constitutional: She is oriented to person, place, and time. She appears well-developed and well-nourished. No distress.  HENT:  Head: Normocephalic and atraumatic.  Cardiovascular: Normal rate.  Respiratory: Effort normal. No respiratory distress.  Genitourinary:    Genitourinary Comments: SSE: +pool, fern pos SVE: 1.5/80/-2, vtx   Musculoskeletal:         General: Normal range of motion.     Cervical back: Normal range of motion.  Neurological: She is alert and oriented to person, place, and time.  Skin: Skin is warm and dry.  Psychiatric: She has a normal mood and affect.  EFM: 140 bpm, mod variability, + accels, no decels Toco: 2-5  Results for orders placed or performed during the hospital encounter of 10/28/19 (from the past 24 hour(s))  POCT fern test     Status: None   Collection Time: 10/28/19  1:06 AM  Result Value Ref Range   POCT Fern Test Positive = ruptured amniotic membanes    MAU Course  Procedures  MDM Labs ordered and reviewed. SROM confirmed. Plan for admit.   Assessment and Plan   1. Post-term pregnancy, 40-42 weeks of gestation   2. NST (non-stress test) reactive    Admit to LD Mngt per primary OB   Donette Larry, CNM 10/28/2019 1:06 AM

## 2019-10-28 NOTE — Anesthesia Procedure Notes (Signed)
Epidural Patient location during procedure: OB Start time: 10/28/2019 5:18 AM End time: 10/28/2019 5:21 AM  Staffing Anesthesiologist: Lyn Hollingshead, MD Performed: anesthesiologist   Preanesthetic Checklist Completed: patient identified, IV checked, site marked, risks and benefits discussed, surgical consent, monitors and equipment checked, pre-op evaluation and timeout performed  Epidural Patient position: sitting Prep: DuraPrep and site prepped and draped Patient monitoring: continuous pulse ox and blood pressure Approach: midline Location: L3-L4 Injection technique: LOR air  Needle:  Needle type: Tuohy  Needle gauge: 17 G Needle length: 9 cm and 9 Needle insertion depth: 6 cm Catheter type: closed end flexible Catheter size: 19 Gauge Catheter at skin depth: 11 cm Test dose: negative and Other  Assessment Events: blood not aspirated, injection not painful, no injection resistance, no paresthesia and negative IV test  Additional Notes Reason for block:procedure for pain

## 2019-10-29 LAB — CBC
HCT: 32.2 % — ABNORMAL LOW (ref 36.0–46.0)
Hemoglobin: 10.3 g/dL — ABNORMAL LOW (ref 12.0–15.0)
MCH: 28 pg (ref 26.0–34.0)
MCHC: 32 g/dL (ref 30.0–36.0)
MCV: 87.5 fL (ref 80.0–100.0)
Platelets: 299 10*3/uL (ref 150–400)
RBC: 3.68 MIL/uL — ABNORMAL LOW (ref 3.87–5.11)
RDW: 15.2 % (ref 11.5–15.5)
WBC: 23.2 10*3/uL — ABNORMAL HIGH (ref 4.0–10.5)
nRBC: 0 % (ref 0.0–0.2)

## 2019-10-29 NOTE — Anesthesia Postprocedure Evaluation (Signed)
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     Last Vitals:  Vitals:   10/29/19 0115 10/29/19 0455  BP: 106/71 116/63  Pulse: 88 82  Resp: 18 18  Temp: 36.8 C (!) 36.4 C  SpO2: 100%     Last Pain:  Vitals:   10/29/19 0700  TempSrc:   PainSc: Asleep   Pain Goal: Patients Stated Pain Goal: 2 (10/28/19 2122)                 Rico Sheehan

## 2019-10-29 NOTE — Progress Notes (Signed)
[place]  10/29/2019  Carrie Dennis [birth year] [id number]   CSW received consult for history of marijuana use.  MOB admits to using THC pen at onset of pregnancy.  MOB is aware of CSW's obligation to report positive urine drug screen, positive Drug Detection Panel, Umbilical Cord Qualitative and positive THC - COOH, Cord Qualitative results to the [place], Child Medical illustrator. ~Baby's UDS is negative.  However, still awaiting results of Drug Detection Panel, Umbilical Cord Qualitative and THC - COOH, Cord Qualitative.  CSW will monitor CDS results and make report to Child Protective Services if warranted.  MOB was also referred for history of depression/anxiety. * Referral screened out by Clinical Social Worker because none of the following criteria appear to apply: ~ History of anxiety/depression during this pregnancy.   ~ Diagnosis of anxiety and/or depression within last 3 years. MOB denies need for psychotropic medications (Antidepressant and/or Antianxiety), and psychotherapy, reporting ability to manage symptoms well.  Please contact the Clinical Social Worker if needs arise, by [facility] request, or if MOB scores greater than 9/yes to question 10 on Edinburgh Postpartum Depression Screen.  CSW provided education regarding the baby blues period vs. perinatal mood disorders, discussed treatment and gave resources for mental health follow up if concerns arise.  CSW recommends self-evaluation during the postpartum time period using the New Mom Checklist from Postpartum Progress and encouraged MOB to contact a medical professional if symptoms are noted at any time.   CSW provided review of Sudden Infant Death Syndrome (SIDS) precautions.    Nat Christen, BSW, MSW, [employer]  Licensed Holiday representative  Cell # [phone]  [email or web address]

## 2019-10-29 NOTE — Lactation Note (Signed)
This note was copied from a baby's chart. Lactation Consultation Note  Patient Name: Carrie Dennis [id number] Date: 10/29/2019 Reason for consult: Initial assessment;1st time breastfeeding;Term P1, 7 hour female infant. Per mom, she is active on the [facility] program in [facility] and has a Spectra DEBP at home.  Per mom, she feels breastfeeding is going well this was her 5 th time breastfeeding infant. When LC entered room , mom had infant latched on left breast in cradle hold position, infant was actively suckling with top lip flange up and lower jaw was extended. Per mom, infant had been breastfeeding for 30 minutes, LC discussed breaking latch and mom's nipples were well rounded no trauma noted. Infant was swaddled in two blankets and LC suggested mom to breastfeed infant STS. Mom taught back hand expression and infant was given 3 mls of colostrum by spoon.  Parents will continue to do STS as much as possible. Mom knows to breastfeed according to hunger cues, 8 to 12 times within 24 hours and on demand. Mom knows if she has any questions, concerns or need assistance with latching infant to breast. Mom made aware of O/P services, breastfeeding support groups, community resources, and our phone # for post-discharge questions.   Maternal Data Formula Feeding for Exclusion: No Has patient been taught Hand Expression?: Yes(Infant was given 3 mls of colostrum by spoon.) Does the patient have breastfeeding experience prior to this delivery?: No  Feeding Feeding Type: Breast Fed  LATCH Score Latch: Grasps breast easily, tongue down, lips flanged, rhythmical sucking.  Audible Swallowing: Spontaneous and intermittent  Type of Nipple: Everted at rest and after stimulation  Comfort (Breast/Nipple): Soft / non-tender  Hold (Positioning): No assistance needed to correctly position infant at breast.  LATCH Score: 10  Interventions Interventions: Breast feeding basics  reviewed;Breast compression;Skin to skin;Position options;Hand express;Expressed milk  Lactation Tools Discussed/Used WIC Program: Yes   Consult Status Consult Status: Follow-up Date: 10/29/19 Follow-up type: In-patient    [name] 10/29/2019, 1:08 AM

## 2019-10-29 NOTE — Progress Notes (Signed)
Patient is doing well.  She is ambulating, voiding, tolerating PO.  Pain control is good.  Lochia is appropriate  Vitals:   10/28/19 1950 10/28/19 2122 10/29/19 0115 10/29/19 0455  BP: 113/85 108/73 106/71 116/63  Pulse: 93 94 88 82  Resp: 18 20 18 18   Temp: 98.1 F (36.7 C) 98.3 F (36.8 C) 98.2 F (36.8 C) (!) 97.5 F (36.4 C)  TempSrc: Oral Oral Oral Oral  SpO2: 98% 100% 100%   Weight:        NAD Fundus firm Ext: trace edema bilaterally  Lab Results  Component Value Date   WBC 23.2 (H) 10/29/2019   HGB 10.3 (L) 10/29/2019   HCT 32.2 (L) 10/29/2019   MCV 87.5 10/29/2019   PLT 299 10/29/2019    --/--/O POS, O POS Performed at [facility] [address], [place], [place] [address]  (12/12 0138)/RImmune  A/P 23 y.o. [id number] PPD#1. Routine care.   Expect d/c tomorrow.    [place]

## 2019-10-29 NOTE — Lactation Note (Signed)
This note was copied from a baby's chart. Lactation Consultation Note  Patient Name: Girl Mayrani Khamis [id number] Date: 10/29/2019   P1 mom feels infant is doing very well.  She feels like she has not been cluster feeding and feeding as often as she thought she would, but that infant is feeding about every 2 hours.  [place] congratulated mom and encouraged her to do lots of STS to give infant plenty of opportunity at the breast.    Infant was in bassinet squirming in swaddled blanket.  LC suggested unwrapping and attempting to feed.  Feeding cues reviewed.  Mom declined assistance with latch but states she will go ahead and try to feed.    Mom and dad deny further needs or questions at this time but will call out for assistance if needed.     Maternal Data    Feeding Feeding Type: Breast Fed  LATCH Score Latch: Repeated attempts needed to sustain latch, nipple held in mouth throughout feeding, stimulation needed to elicit sucking reflex.  Audible Swallowing: Spontaneous and intermittent  Type of Nipple: Everted at rest and after stimulation  Comfort (Breast/Nipple): Soft / non-tender  Hold (Positioning): Assistance needed to correctly position infant at breast and maintain latch.  LATCH Score: 8  Interventions    Lactation Tools Discussed/Used     Consult Status      Ferne Coe [facility] 10/29/2019, 7:44 PM

## 2019-10-30 ENCOUNTER — Other Ambulatory Visit (HOSPITAL_COMMUNITY): Admission: RE | Admit: 2019-10-30 | Payer: Medicaid Other | Source: Ambulatory Visit

## 2019-10-30 MED ORDER — DOCUSATE SODIUM 100 MG PO CAPS: 100.0000 mg | ORAL_CAPSULE | Freq: Two times a day (BID) | ORAL | 0 refills | Status: DC

## 2019-10-30 MED ORDER — IBUPROFEN 600 MG PO TABS: 600.0000 mg | ORAL_TABLET | Freq: Four times a day (QID) | ORAL | 0 refills | Status: DC | PRN

## 2019-10-30 NOTE — Lactation Note (Signed)
This note was copied from a baby's chart. Lactation Consultation Note  Patient Name: Carrie Dennis [id number] Date: 10/30/2019 Reason for consult: Follow-up assessment  P1 mother whose infant is now 56 hours old.  Baby's discharge is being held for awhile until she is able to void.  It has been over 24 hours since her last charted void.    Mother had just finished breast feeding baby when I arrived.  She was sleepy and content.  Offered to do some warm water massage to stimulate baby to void and mother agreeable.  Gentle massage performed with warm water.  Baby unable to void at this time.  Swaddled her and mother will be observant for the next voiding.  She will call her RN as soon as she changes a wet diaper.  Encouraged to continue feeding 8-12 times/24 hours or sooner if baby shows cues.  Engorgement prevention/treatment discussed.  Manual pump with instructions for use given.  #24 flange size is appropriate at this time, however, also provided a #27 since she may need to increase size as milk comes to volume.  Mother has a DEBP for home use.  She has our OP phone number for questions/concerns after discharge.  She will call for assistance as needed.  RN updated.   Maternal Data    Feeding Feeding Type: Breast Fed  LATCH Score                   Interventions    Lactation Tools Discussed/Used     Consult Status Consult Status: Complete Date: 10/30/19 Follow-up type: Call as needed    [name] R [name] 10/30/2019, 12:30 PM

## 2019-10-30 NOTE — Discharge Summary (Signed)
Obstetric Discharge Summary Reason for Admission: onset of labor Prenatal Procedures: NST and ultrasound Intrapartum Procedures: spontaneous vaginal delivery Postpartum Procedures: none Complications-Operative and Postpartum: none Hemoglobin  Date Value Ref Range Status  10/29/2019 10.3 (L) 12.0 - 15.0 g/dL Final   HCT  Date Value Ref Range Status  10/29/2019 32.2 (L) 36.0 - 46.0 % Final    Physical Exam:  General: alert, cooperative and appears stated age 23: appropriate Uterine Fundus: firm DVT Evaluation: No evidence of DVT seen on physical exam.  Discharge Diagnoses: Term Pregnancy-delivered  Discharge Information: Date: 10/30/2019 Activity: pelvic rest Diet: routine Medications: Ibuprofen and Colace Condition: improved Instructions: refer to practice specific booklet Discharge to: home Follow-up Information    Jerelyn Charles, MD Follow up in 4 week(s).   Specialty: Obstetrics Why: In 4 weeks for a postpartum evaluation Contact information: [address] [place] [address] [phone]           Newborn Data: Live born female  Birth Weight: 7 lb 15.9 oz (3625 g) APGAR: 8, 9  Newborn Delivery   Time head delivered: 10/28/2019 17:11:40 Birth date/time: 10/28/2019 17:12:00 Delivery type: Vaginal, Spontaneous      Home with mother.  Vanessa Kick 10/30/2019, 10:43 AM

## 2019-11-01 ENCOUNTER — Inpatient Hospital Stay (HOSPITAL_COMMUNITY): Payer: Medicaid Other

## 2019-11-01 ENCOUNTER — Inpatient Hospital Stay (HOSPITAL_COMMUNITY)
Admission: AD | Admit: 2019-11-01 | Payer: Medicaid Other | Source: Home / Self Care | Admitting: Obstetrics and Gynecology

## 2020-08-08 ENCOUNTER — Inpatient Hospital Stay (HOSPITAL_COMMUNITY): Payer: Medicaid Other

## 2020-08-08 ENCOUNTER — Inpatient Hospital Stay (HOSPITAL_COMMUNITY)
Admission: AD | Admit: 2020-08-08 | Discharge: 2020-08-08 | Disposition: A | Discharge status: Home / Self Care | Payer: Medicaid Other | Attending: Obstetrics and Gynecology | Admitting: Obstetrics and Gynecology

## 2020-08-08 ENCOUNTER — Other Ambulatory Visit: Payer: Self-pay

## 2020-08-08 ENCOUNTER — Encounter (HOSPITAL_COMMUNITY): Payer: Self-pay | Admitting: Obstetrics and Gynecology

## 2020-08-08 DIAGNOSIS — Z6790 Unspecified blood type, Rh positive: Secondary | ICD-10-CM | POA: Insufficient documentation

## 2020-08-08 DIAGNOSIS — R110 Nausea: Secondary | ICD-10-CM | POA: Diagnosis not present

## 2020-08-08 DIAGNOSIS — F1290 Cannabis use, unspecified, uncomplicated: Secondary | ICD-10-CM | POA: Insufficient documentation

## 2020-08-08 DIAGNOSIS — O4690 Antepartum hemorrhage, unspecified, unspecified trimester: Secondary | ICD-10-CM

## 2020-08-08 DIAGNOSIS — O26891 Other specified pregnancy related conditions, first trimester: Secondary | ICD-10-CM | POA: Insufficient documentation

## 2020-08-08 DIAGNOSIS — O3680X0 Pregnancy with inconclusive fetal viability, not applicable or unspecified: Secondary | ICD-10-CM | POA: Diagnosis not present

## 2020-08-08 DIAGNOSIS — R197 Diarrhea, unspecified: Secondary | ICD-10-CM | POA: Insufficient documentation

## 2020-08-08 DIAGNOSIS — Z79899 Other long term (current) drug therapy: Secondary | ICD-10-CM | POA: Insufficient documentation

## 2020-08-08 DIAGNOSIS — O209 Hemorrhage in early pregnancy, unspecified: Secondary | ICD-10-CM | POA: Diagnosis not present

## 2020-08-08 DIAGNOSIS — O99322 Drug use complicating pregnancy, second trimester: Secondary | ICD-10-CM | POA: Insufficient documentation

## 2020-08-08 DIAGNOSIS — Z3A01 Less than 8 weeks gestation of pregnancy: Secondary | ICD-10-CM | POA: Insufficient documentation

## 2020-08-08 DIAGNOSIS — R109 Unspecified abdominal pain: Secondary | ICD-10-CM | POA: Insufficient documentation

## 2020-08-08 LAB — WET PREP, GENITAL
Clue Cells Wet Prep HPF POC: NONE SEEN
Sperm: NONE SEEN
Trich, Wet Prep: NONE SEEN
Yeast Wet Prep HPF POC: NONE SEEN

## 2020-08-08 LAB — URINALYSIS, MICROSCOPIC (REFLEX)

## 2020-08-08 LAB — CBC
HCT: 40.1 % (ref 36.0–46.0)
Hemoglobin: 12.8 g/dL (ref 12.0–15.0)
MCH: 26.1 pg (ref 26.0–34.0)
MCHC: 31.9 g/dL (ref 30.0–36.0)
MCV: 81.7 fL (ref 80.0–100.0)
Platelets: 370 10*3/uL (ref 150–400)
RBC: 4.91 MIL/uL (ref 3.87–5.11)
RDW: 13.5 % (ref 11.5–15.5)
WBC: 10.3 10*3/uL (ref 4.0–10.5)
nRBC: 0 % (ref 0.0–0.2)

## 2020-08-08 LAB — URINALYSIS, ROUTINE W REFLEX MICROSCOPIC
Bilirubin Urine: NEGATIVE
Glucose, UA: NEGATIVE mg/dL
Ketones, ur: NEGATIVE mg/dL
Leukocytes,Ua: NEGATIVE
Nitrite: NEGATIVE
Protein, ur: NEGATIVE mg/dL
Specific Gravity, Urine: <1.005 — ABNORMAL LOW (ref 1.005–1.030)
pH: 6 (ref 5.0–8.0)

## 2020-08-08 LAB — HCG, QUANTITATIVE, PREGNANCY: hCG, Beta Chain, Quant, S: 48832 m[IU]/mL — ABNORMAL HIGH (ref <5)

## 2020-08-08 LAB — POCT PREGNANCY, URINE: Preg Test, Ur: POSITIVE — AB

## 2020-08-08 NOTE — Discharge Instructions (Signed)

## 2020-08-08 NOTE — MAU Note (Signed)
Discharge instructions reviewed.  Pt will return to MAU on Saturday for f/u.  Dc'd home in good condition.

## 2020-08-08 NOTE — MAU Note (Signed)
Presents stating she's having an "early miscarriage".  States she sneezed and began having VB and had bled for the past 2 hours although not enough to wear pad or panty liner.  Also states having abdominal cramping, stronger than menstrual cramps.  Unsure of LMP, states late July/early August.  +HPT.

## 2020-08-08 NOTE — MAU Provider Note (Signed)
History     CSN: [id number]  Arrival date and time: 08/08/20 1418   First Provider Initiated Contact with Patient 08/08/20 1514      Chief Complaint  Patient presents with  . Vaginal Bleeding  . Abdominal Pain   24 y.o. [id number] @[redacted]w[redacted]d  by LMP presenting with VB and cramping. Cramping started this am. Rates pain 8/10. Has not tried anything for it. VB started around 1pm. She felt a gush of bright red blood and had more bleeding after she moved around. Also reports daily diarrhea for the last week.  OB History    Gravida  3   Para  1   Term  1   Preterm      AB  1   Living  1     SAB      TAB  1   Ectopic      Multiple  0   Live Births  1           Past Medical History:  Diagnosis Date  . Anxiety   . Depression   . Headache   . PONV (postoperative nausea and vomiting)   . Sleep apnea     Past Surgical History:  Procedure Laterality Date  . ADENOIDECTOMY    . TONSILLECTOMY      Family History  Problem Relation Age of Onset  . Cancer Mother   . COPD Maternal Grandmother   . Heart disease Maternal Grandfather   . Stroke Maternal Grandfather     Social History   Tobacco Use  . Smoking status: Never Smoker  . Smokeless tobacco: Never Used  Vaping Use  . Vaping Use: Never assessed  Substance Use Topics  . Alcohol use: No  . Drug use: Yes    Types: Marijuana    Comment: last used around July 2021    Allergies:  Allergies  Allergen Reactions  . Benadryl [Diphenhydramine Hcl] Anaphylaxis  . Penicillins Anaphylaxis    *says only antibiotic agent she can safely take is erythromycin  . Sulfa Antibiotics Anaphylaxis  . Neomycin-Polymyxin B Gu Rash    No medications prior to admission.    Review of Systems  Constitutional: Negative for fever.  Gastrointestinal: Positive for abdominal pain, diarrhea and nausea.  Genitourinary: Positive for vaginal bleeding.   Physical Exam   Blood pressure 120/74, pulse 80, temperature 98.3 F (36.8  C), temperature source Oral, resp. rate 16, height 5\' 2"  (1.575 m), weight 75.4 kg, last menstrual period 06/14/2020, SpO2 100 %, unknown if currently breastfeeding.  Physical Exam Vitals and nursing note reviewed. Exam conducted with a chaperone present.  Constitutional:      General: She is not in acute distress (appears comfortable).    Appearance: Normal appearance.  HENT:     Head: Normocephalic and atraumatic.  Cardiovascular:     Rate and Rhythm: Normal rate.  Pulmonary:     Effort: Pulmonary effort is normal. No respiratory distress.  Abdominal:     General: There is no distension.     Palpations: Abdomen is soft.     Tenderness: There is no abdominal tenderness. There is no guarding or rebound.  Genitourinary:    Comments: External: no lesions or erythema Vagina: rugated, pink, moist, mod amt bloody discharge, cleared with 3 fox swabs Uterus: + enlarged, anteverted, no tender, no CMT Adnexae: no masses, no tenderness left, no tenderness right Cervix closed  Musculoskeletal:        General: Normal range of motion.  Cervical back: Normal range of motion.  Skin:    General: Skin is warm and dry.  Neurological:     General: No focal deficit present.     Mental Status: She is alert and oriented to person, place, and time.  Psychiatric:        Mood and Affect: Mood normal.        Behavior: Behavior normal.    Results for orders placed or performed during the hospital encounter of 08/08/20 (from the past 24 hour(s))  Pregnancy, urine POC     Status: Abnormal   Collection Time: 08/08/20  2:28 PM  Result Value Ref Range   Preg Test, Ur POSITIVE (A) NEGATIVE  Urinalysis, Routine w reflex microscopic     Status: Abnormal   Collection Time: 08/08/20  2:32 PM  Result Value Ref Range   Color, Urine YELLOW YELLOW   APPearance CLEAR CLEAR   Specific Gravity, Urine <1.005 (L) 1.005 - 1.030   pH 6.0 5.0 - 8.0   Glucose, UA NEGATIVE NEGATIVE mg/dL   Hgb urine dipstick  MODERATE (A) NEGATIVE   Bilirubin Urine NEGATIVE NEGATIVE   Ketones, ur NEGATIVE NEGATIVE mg/dL   Protein, ur NEGATIVE NEGATIVE mg/dL   Nitrite NEGATIVE NEGATIVE   Leukocytes,Ua NEGATIVE NEGATIVE  Urinalysis, Microscopic (reflex)     Status: Abnormal   Collection Time: 08/08/20  2:32 PM  Result Value Ref Range   RBC / HPF 0-5 0 - 5 RBC/hpf   WBC, UA 0-5 0 - 5 WBC/hpf   Bacteria, UA RARE (A) NONE SEEN   Squamous Epithelial / LPF 6-10 0 - 5  CBC     Status: None   Collection Time: 08/08/20  3:29 PM  Result Value Ref Range   WBC 10.3 4.0 - 10.5 K/uL   RBC 4.91 3.87 - 5.11 MIL/uL   Hemoglobin 12.8 12.0 - 15.0 g/dL   HCT 37.1 36 - 46 %   MCV 81.7 80.0 - 100.0 fL   MCH 26.1 26.0 - 34.0 pg   MCHC 31.9 30.0 - 36.0 g/dL   RDW 69.6 78.9 - 38.1 %   Platelets 370 150 - 400 K/uL   nRBC 0.0 0.0 - 0.2 %  hCG, quantitative, pregnancy     Status: Abnormal   Collection Time: 08/08/20  3:29 PM  Result Value Ref Range   hCG, Beta Chain, Quant, S 01,751 (H) <5 mIU/mL  Wet prep, genital     Status: Abnormal   Collection Time: 08/08/20  3:30 PM   Specimen: PATH Cytology Cervicovaginal Ancillary Only  Result Value Ref Range   Yeast Wet Prep HPF POC NONE SEEN NONE SEEN   Trich, Wet Prep NONE SEEN NONE SEEN   Clue Cells Wet Prep HPF POC NONE SEEN NONE SEEN   WBC, Wet Prep HPF POC MODERATE (A) NONE SEEN   Sperm NONE SEEN    US OB LESS THAN 14 WEEKS WITH OB TRANSVAGINAL  Result Date: 08/08/2020 CLINICAL DATA:  Unsure last menstrual period, vaginal bleeding today. G3P1A1 EXAM: OBSTETRIC <14 WK [place] AND TRANSVAGINAL OB US TECHNIQUE: Both transabdominal and transvaginal ultrasound examinations were performed for complete evaluation of the gestation as well as the maternal uterus, adnexal regions, and pelvic cul-de-sac. Transvaginal technique was performed to assess early pregnancy. COMPARISON:  None. FINDINGS: Intrauterine gestational sac: Single irregular-shaped gestational sac. Yolk sac:  Not Visualized.  Embryo:  Not Visualized. Cardiac Activity: Not Visualized. MSD: 18.4 mm   6 w   5 d Subchorionic hemorrhage:  None visualized. Maternal uterus/adnexae: The right ovary is normal and only seen on the transvaginal images. The right ovary measures 3.4 x 2.1 x 3.6 cm. The left ovary appears normal and is only seen on the transabdominal images. The left ovary measures 2.4 x 1.6 x 1.9 cm. The uterus is otherwise unremarkable. Other: Trace free fluid within the pelvis. IMPRESSION: 1. Single irregular shaped intrauterine gestational sac with no yolk sac, fetal pole, or fetal cardiac activity identified. Findings likely represents a non-viable intrauterine pregnancy; however, an early intrauterine pregnancy cannot be fully excluded. Recommend follow-up quantitative B-HCG levels and follow-up US in 14 days to assess viability. This recommendation follows SRU consensus guidelines: Diagnostic Criteria for Nonviable Pregnancy Early in the First Trimester. Malva Limes Med 2013; 163:8453-64. 2. Unremarkable left ovary that is only visualized on transabdominal images. Electronically Signed   By: Tish Frederickson M.D.   On: 08/08/2020 16:26   MAU Course  Procedures  MDM Labs and [place] ordered and reviewed. Irregular IUGS and s<d with no YS or FP seen on [place], findings may indicate failed pregnancy but cannot r/o early pregnancy or ectopic pregnancy, discussed with pt. Will follow quant in 48 hrs. Stable for discharge home.   Assessment and Plan   1. Pregnancy, location unknown   2. Vaginal bleeding in pregnancy   3. Blood type, Rh positive    Discharge home Follow up in MAU on 08/10/20 for qhcg Ectopic/SAB precautions Pelvic rest  Allergies as of 08/08/2020      Reactions   Benadryl [diphenhydramine Hcl] Anaphylaxis   Penicillins Anaphylaxis   *says only antibiotic agent she can safely take is erythromycin   Sulfa Antibiotics Anaphylaxis   Neomycin-polymyxin B Gu Rash      Medication List    STOP taking these  medications   docusate sodium 100 MG capsule Commonly known as: Colace   ibuprofen 600 MG tablet Commonly known as: Thelma Comp, CNM 08/08/2020, 6:49 PM

## 2020-08-09 LAB — GC/CHLAMYDIA PROBE AMP (~~LOC~~) NOT AT ARMC
Chlamydia: NEGATIVE
Comment: NEGATIVE
Comment: NORMAL
Neisseria Gonorrhea: NEGATIVE

## 2020-08-10 ENCOUNTER — Inpatient Hospital Stay (HOSPITAL_COMMUNITY)
Admission: AD | Admit: 2020-08-10 | Discharge: 2020-08-10 | Disposition: A | Discharge status: Home / Self Care | Payer: Medicaid Other | Attending: Obstetrics | Admitting: Obstetrics

## 2020-08-10 ENCOUNTER — Other Ambulatory Visit: Payer: Self-pay

## 2020-08-10 ENCOUNTER — Inpatient Hospital Stay (HOSPITAL_COMMUNITY): Payer: Medicaid Other

## 2020-08-10 DIAGNOSIS — Z3A01 Less than 8 weeks gestation of pregnancy: Secondary | ICD-10-CM | POA: Insufficient documentation

## 2020-08-10 DIAGNOSIS — Z3491 Encounter for supervision of normal pregnancy, unspecified, first trimester: Secondary | ICD-10-CM | POA: Diagnosis not present

## 2020-08-10 DIAGNOSIS — O209 Hemorrhage in early pregnancy, unspecified: Secondary | ICD-10-CM | POA: Diagnosis present

## 2020-08-10 LAB — HCG, QUANTITATIVE, PREGNANCY: hCG, Beta Chain, Quant, S: 59983 m[IU]/mL — ABNORMAL HIGH (ref <5)

## 2020-08-10 NOTE — MAU Note (Signed)
Carrie Dennis is a 24 y.o. at [redacted]w[redacted]d here in MAU reporting: here for follow up hcg. States still having some bleeding but it has decreased since previous visit. Only seeing bleeding when wiping in the bathroom. Denies pain.  Pain score: 0/10  Vitals:   08/10/20 1420  BP: 114/60  Pulse: 83  Resp: 16  Temp: 98.6 F (37 C)  SpO2: 100%     Lab orders placed from triage: hcg

## 2020-08-10 NOTE — MAU Provider Note (Signed)
History   Chief Complaint:  Follow-up   Carrie Dennis is  24 y.o. G3P1011 Patient's last menstrual period was 06/14/2020.Marland Kitchen Patient is here for follow up of quantitative HCG and ongoing surveillance of pregnancy status. She is [redacted]w[redacted]d weeks gestation  by LMP.    Since her last visit, the patient is without new complaint. The patient reports bleeding as  spotting.  She denies any pain.  General ROS:  negative  Her previous Quantitative HCG values are:  Results for YENI, JIGGETTS (MRN [id number]) as of 08/10/2020 18:14  Ref. Range 08/08/2020 15:29  HCG, Beta Chain, Quant, S Latest Ref Range: <5 mIU/mL 91,478 (H)   Physical Exam   Blood pressure 114/60, pulse 83, temperature 98.6 F (37 C), temperature source Oral, resp. rate 16, last menstrual period 06/14/2020, SpO2 100 %, unknown if currently breastfeeding.  Focused Gynecological Exam: examination not indicated  Labs: Results for orders placed or performed during the hospital encounter of 08/10/20 (from the past 24 hour(s))  hCG, quantitative, pregnancy   Collection Time: 08/10/20  3:05 PM  Result Value Ref Range   hCG, Beta Chain, Quant, S 59,983 (H) <5 mIU/mL    Ultrasound Studies:   US OB Transvaginal  Result Date: 08/10/2020 CLINICAL DATA:  Vaginal bleeding, positive pregnancy test EXAM: TRANSVAGINAL OB ULTRASOUND TECHNIQUE: Transvaginal ultrasound was performed for complete evaluation of the gestation as well as the maternal uterus, adnexal regions, and pelvic cul-de-sac. COMPARISON:  None. FINDINGS: Intrauterine gestational sac: Irregular gestational sac is noted. Yolk sac:  Present Embryo:  Present Cardiac Activity: Present Heart Rate: 75 bpm CRL:   3 mm   5 w 5 d                  [place] EDC: 04/07/2021 Subchorionic hemorrhage:  None Maternal uterus/adnexae: Ovaries are within normal limits. IMPRESSION: Early live intrauterine gestation. Mild irregularity of the gestational sac is noted of uncertain significance. Continued  follow-up can be performed as clinically indicated. Electronically Signed   By: Alcide Clever M.D.   On: 08/10/2020 17:38   US OB LESS THAN 14 WEEKS WITH OB TRANSVAGINAL  Result Date: 08/08/2020 CLINICAL DATA:  Unsure last menstrual period, vaginal bleeding today. G3P1A1 EXAM: OBSTETRIC <14 WK [place] AND TRANSVAGINAL OB US TECHNIQUE: Both transabdominal and transvaginal ultrasound examinations were performed for complete evaluation of the gestation as well as the maternal uterus, adnexal regions, and pelvic cul-de-sac. Transvaginal technique was performed to assess early pregnancy. COMPARISON:  None. FINDINGS: Intrauterine gestational sac: Single irregular-shaped gestational sac. Yolk sac:  Not Visualized. Embryo:  Not Visualized. Cardiac Activity: Not Visualized. MSD: 18.4 mm   6 w   5 d Subchorionic hemorrhage:  None visualized. Maternal uterus/adnexae: The right ovary is normal and only seen on the transvaginal images. The right ovary measures 3.4 x 2.1 x 3.6 cm. The left ovary appears normal and is only seen on the transabdominal images. The left ovary measures 2.4 x 1.6 x 1.9 cm. The uterus is otherwise unremarkable. Other: Trace free fluid within the pelvis. IMPRESSION: 1. Single irregular shaped intrauterine gestational sac with no yolk sac, fetal pole, or fetal cardiac activity identified. Findings likely represents a non-viable intrauterine pregnancy; however, an early intrauterine pregnancy cannot be fully excluded. Recommend follow-up quantitative B-HCG levels and follow-up US in 14 days to assess viability. This recommendation follows SRU consensus guidelines: Diagnostic Criteria for Nonviable Pregnancy Early in the First Trimester. Malva Limes Med 2013; 295:6213-08. 2. Unremarkable left ovary that is only visualized  on transabdominal images. Electronically Signed   By: Tish Frederickson M.D.   On: 08/08/2020 16:26    Assessment:   1. Normal intrauterine pregnancy on prenatal ultrasound in first trimester    2. [redacted] weeks gestation of pregnancy      Plan: -Discharge home in stable condition -First trimester precautions discussed -Patient advised to follow-up with OB to establish prenatal care -Patient may return to MAU as needed or if her condition were to change or worsen  Rolm Bookbinder, CNM 08/10/2020, 6:04 PM

## 2020-08-10 NOTE — Discharge Instructions (Signed)

## 2020-08-28 ENCOUNTER — Other Ambulatory Visit: Payer: Self-pay | Admitting: Obstetrics and Gynecology

## 2020-08-28 ENCOUNTER — Inpatient Hospital Stay (HOSPITAL_COMMUNITY)
Admission: AD | Admit: 2020-08-28 | Discharge: 2020-08-28 | Disposition: A | Discharge status: Home / Self Care | Payer: Medicaid Other | Attending: Obstetrics and Gynecology | Admitting: Obstetrics and Gynecology

## 2020-08-28 ENCOUNTER — Other Ambulatory Visit: Payer: Self-pay

## 2020-08-28 DIAGNOSIS — O008 Other ectopic pregnancy without intrauterine pregnancy: Secondary | ICD-10-CM

## 2020-08-28 DIAGNOSIS — O009 Unspecified ectopic pregnancy without intrauterine pregnancy: Secondary | ICD-10-CM | POA: Diagnosis not present

## 2020-08-28 DIAGNOSIS — O26891 Other specified pregnancy related conditions, first trimester: Secondary | ICD-10-CM | POA: Insufficient documentation

## 2020-08-28 DIAGNOSIS — O00109 Unspecified tubal pregnancy without intrauterine pregnancy: Secondary | ICD-10-CM

## 2020-08-28 LAB — COMPREHENSIVE METABOLIC PANEL
ALT: 17 U/L (ref 0–44)
AST: 18 U/L (ref 15–41)
Albumin: 4.2 g/dL (ref 3.5–5.0)
Alkaline Phosphatase: 88 U/L (ref 38–126)
Anion gap: 9 (ref 5–15)
BUN: 6 mg/dL (ref 6–20)
CO2: 24 mmol/L (ref 22–32)
Calcium: 9.5 mg/dL (ref 8.9–10.3)
Chloride: 103 mmol/L (ref 98–111)
Creatinine, Ser: 0.7 mg/dL (ref 0.44–1.00)
GFR, Estimated: >60 mL/min (ref >60)
Glucose, Bld: 93 mg/dL (ref 70–99)
Potassium: 4.1 mmol/L (ref 3.5–5.1)
Sodium: 136 mmol/L (ref 135–145)
Total Bilirubin: 0.6 mg/dL (ref 0.3–1.2)
Total Protein: 7 g/dL (ref 6.5–8.1)

## 2020-08-28 LAB — CBC WITH DIFFERENTIAL/PLATELET
Abs Immature Granulocytes: 0.05 10*3/uL (ref 0.00–0.07)
Basophils Absolute: 0.1 10*3/uL (ref 0.0–0.1)
Basophils Relative: 0 %
Eosinophils Absolute: 0.1 10*3/uL (ref 0.0–0.5)
Eosinophils Relative: 0 %
HCT: 40.7 % (ref 36.0–46.0)
Hemoglobin: 13 g/dL (ref 12.0–15.0)
Immature Granulocytes: 0 %
Lymphocytes Relative: 22 %
Lymphs Abs: 2.9 10*3/uL (ref 0.7–4.0)
MCH: 26.2 pg (ref 26.0–34.0)
MCHC: 31.9 g/dL (ref 30.0–36.0)
MCV: 81.9 fL (ref 80.0–100.0)
Monocytes Absolute: 0.6 10*3/uL (ref 0.1–1.0)
Monocytes Relative: 4 %
Neutro Abs: 9.6 10*3/uL — ABNORMAL HIGH (ref 1.7–7.7)
Neutrophils Relative %: 74 %
Platelets: 407 10*3/uL — ABNORMAL HIGH (ref 150–400)
RBC: 4.97 MIL/uL (ref 3.87–5.11)
RDW: 13.5 % (ref 11.5–15.5)
WBC: 13.2 10*3/uL — ABNORMAL HIGH (ref 4.0–10.5)
nRBC: 0 % (ref 0.0–0.2)

## 2020-08-28 LAB — HCG, QUANTITATIVE, PREGNANCY: hCG, Beta Chain, Quant, S: 113968 m[IU]/mL — ABNORMAL HIGH (ref <5)

## 2020-08-28 MED ORDER — METHOTREXATE FOR ECTOPIC PREGNANCY
50.0000 mg/m2 | Freq: Once | INTRAMUSCULAR | Status: AC
  Administered 2020-08-28: 90 mg via INTRAMUSCULAR
  Filled 2020-08-28: qty 1

## 2020-08-28 NOTE — MAU Provider Note (Addendum)
First Provider Initiated Contact with Patient 08/28/20 2018      S Ms. Cyriah Childrey is a 24 y.o. [phone] patient who presents to MAU today for MTX after she was seen in the office by her community OB and sent to MAU. The community OB will manage the administration of MTX. Patient denies pain at this time and reports only spotting.   O BP 139/72   Pulse 84   Temp 98.3 F (36.8 C)   Resp 18   Ht 5\' 2"  (1.575 m)   Wt 74.8 kg   LMP 06/14/2020 Comment: Late July/Early August  BMI 30.18 kg/m    Physical Exam Vitals and nursing note reviewed.  Constitutional:      General: She is not in acute distress.    Appearance: Normal appearance. She is normal weight. She is not ill-appearing, toxic-appearing or diaphoretic.  HENT:     Head: Normocephalic and atraumatic.  Pulmonary:     Effort: Pulmonary effort is normal.  Neurological:     Mental Status: She is alert and oriented to person, place, and time.  Psychiatric:        Mood and Affect: Mood normal.        Behavior: Behavior normal.        Thought Content: Thought content normal.        Judgment: Judgment normal.    A Medical screening exam complete  Orders entered by MAU provider as no notification was given to MAU Providers that community OB would be managing process until after patient labs had already been drawn  P Discharge from MAU in stable condition s/p MTX Ectopic precautions given Warning signs for worsening condition that would warrant emergency follow-up discussed Patient may return to MAU as needed   Cabrina Shiroma, September, NP 08/28/2020 9:28 PM

## 2020-08-28 NOTE — Discharge Instructions (Signed)
Methotrexate Treatment for an Ectopic Pregnancy, Care After This sheet gives you information about how to care for yourself after your procedure. Your health care provider may also give you more specific instructions. If you have problems or questions, contact your health care provider. What can I expect after the procedure? After the procedure, it is common to have:  Abdominal cramping.  Vaginal bleeding.  Fatigue.  Nausea.  Vomiting.  Diarrhea. Blood tests will be taken at timed intervals for several days or weeks to check your pregnancy hormone levels. The blood tests will be done until the pregnancy hormone can no longer be detected in the blood. Follow these instructions at home: Activity  Do not have sex until your health care provider approves.  Limit activities that take a lot of effort as told by your health care provider. Medicines  Take over the counter and prescription medicines only as told by your health care provider.  Do not take aspirin, ibuprofen, naproxen, or any other NSAIDs.  Do not take folic acid, prenatal vitamins, or other vitamins that contain folic acid. General instructions   Do not drink alcohol.  Follow instructions from your health care provider on how and when to report any symptoms that may indicate a ruptured ectopic pregnancy.  Keep all follow-up visits as told by your health care provider. This is important. Contact a health care provider if:  You have persistent nausea and vomiting.  You have persistent diarrhea.  You are having a reaction to the medicine, such as: ? Tiredness. ? Skin rash. ? Hair loss. Get help right away if:  Your abdominal or pelvic pain gets worse.  You have more vaginal bleeding.  You feel light-headed or you faint.  You have shortness of breath.  Your heart rate increases.  You develop a cough.  You have chills.  You have a fever. Summary  After the procedure, it is common to have symptoms  of abdominal cramping, vaginal bleeding and fatigue. You may also experience other symptoms.  Blood tests will be taken at timed intervals for several days or weeks to check your pregnancy hormone levels. The blood tests will be done until the pregnancy hormone can no longer be detected in the blood.  Limit strenuous activity as told by your health care provider.  Follow instructions from your health care provider on how and when to report any symptoms that may indicate a ruptured ectopic pregnancy. This information is not intended to replace advice given to you by your health care provider. Make sure you discuss any questions you have with your health care provider. Document Revised: 10/15/2017 Document Reviewed: 12/22/2016 Elsevier Patient Education  2020 Elsevier Inc.  

## 2020-08-28 NOTE — MAU Note (Signed)
Spoke with Dr Timothy Lasso regarding d/c order for pt. Aware pt received MTX and day 4 labs will be Sat at MAU. Pt to call office in the am to confirm pt's day 7 f/u will be in the office.

## 2020-08-28 NOTE — Progress Notes (Addendum)
Written and verbal d/c instructions given and understanding voiced. Pt understands not to take PNV or multivitamin. To avoid foods high in folic acid and pt will google that info. Explained to take tylenol and not ibuprofen if anything needed for pain. To return for severe abdominal pain, heavy vag bleeding, persistent dizziness.

## 2020-08-28 NOTE — MAU Note (Signed)
Was seen at office today and dx with ectopic pregnancy. Here for MTX. Discussed importance of f/u with blood work and s/s for which to return to MAU. Understands will return to MAU Sat for repeat BHCG. Handout for Ectopics/MTX given to pt and reviewed.

## 2020-08-28 NOTE — MAU Note (Signed)
MTX given and questions answered after pt and significant other had time to look over handout and think about the info. Pt understands to wait and then she will receive d/c papers. Understanding voiced

## 2020-08-31 ENCOUNTER — Other Ambulatory Visit: Payer: Self-pay

## 2020-08-31 ENCOUNTER — Inpatient Hospital Stay (HOSPITAL_COMMUNITY)
Admission: AD | Admit: 2020-08-31 | Discharge: 2020-08-31 | Disposition: A | Discharge status: Home / Self Care | Payer: Medicaid Other | Source: Ambulatory Visit | Attending: Obstetrics and Gynecology | Admitting: Obstetrics and Gynecology

## 2020-08-31 DIAGNOSIS — O00109 Unspecified tubal pregnancy without intrauterine pregnancy: Secondary | ICD-10-CM

## 2020-08-31 DIAGNOSIS — O008 Other ectopic pregnancy without intrauterine pregnancy: Secondary | ICD-10-CM | POA: Diagnosis present

## 2020-08-31 LAB — HCG, QUANTITATIVE, PREGNANCY: hCG, Beta Chain, Quant, S: 82523 m[IU]/mL — ABNORMAL HIGH (ref <5)

## 2020-08-31 NOTE — MAU Note (Signed)
Carrie Dennis is a 24 y.o. here in MAU reporting: for follow up blood work. Pain score: denies Vaginal bleeding: denies Vitals:   08/31/20 1608  BP: 109/69  Pulse: 74  Resp: 17  Temp: 98 F (36.7 C)  SpO2: 100%

## 2020-08-31 NOTE — MAU Note (Signed)
Dr. Tenny Craw called and informed of resulted HCG. No new orders received.

## 2020-08-31 NOTE — Progress Notes (Signed)
Patient here for day 4 HCG after methotrexate. MTX was given on 10/13 for cornual ectopic pregnancy. Mtx administration and HCG evaluation is being managed by her ob/gyn (Dr. Tenny Craw).  Medical screening exam performed today. Patient denies pain or bleeding.   Judeth Horn, NP

## 2020-08-31 NOTE — MAU Note (Signed)
Dr. Tenny Craw called and made aware of patient arrival. Informed patient asymptomatic with no c/o bleeding or pain. Per MD, patient is okay to to leave after lab draw.

## 2021-06-03 LAB — OB RESULTS CONSOLE GC/CHLAMYDIA
Chlamydia: NEGATIVE
Gonorrhea: NEGATIVE

## 2021-07-07 LAB — OB RESULTS CONSOLE ABO/RH: RH Type: POSITIVE

## 2021-07-07 LAB — OB RESULTS CONSOLE HEPATITIS B SURFACE ANTIGEN: Hepatitis B Surface Ag: NEGATIVE

## 2021-07-07 LAB — OB RESULTS CONSOLE HIV ANTIBODY (ROUTINE TESTING): HIV: NONREACTIVE

## 2021-07-07 LAB — OB RESULTS CONSOLE RPR: RPR: NONREACTIVE

## 2021-07-07 LAB — OB RESULTS CONSOLE RUBELLA ANTIBODY, IGM: Rubella: IMMUNE

## 2021-07-07 LAB — OB RESULTS CONSOLE ANTIBODY SCREEN: Antibody Screen: NEGATIVE

## 2021-07-07 LAB — HEPATITIS C ANTIBODY: HCV Ab: NEGATIVE

## 2021-08-11 ENCOUNTER — Other Ambulatory Visit: Payer: Self-pay | Admitting: Obstetrics and Gynecology

## 2021-08-11 DIAGNOSIS — Z363 Encounter for antenatal screening for malformations: Secondary | ICD-10-CM

## 2021-08-12 ENCOUNTER — Other Ambulatory Visit: Payer: Self-pay

## 2021-08-26 ENCOUNTER — Encounter: Payer: Self-pay | Admitting: *Deleted

## 2021-08-28 ENCOUNTER — Encounter: Payer: Self-pay | Admitting: *Deleted

## 2021-08-28 ENCOUNTER — Ambulatory Visit: Payer: Medicaid Other | Attending: Obstetrics and Gynecology

## 2021-08-28 ENCOUNTER — Ambulatory Visit: Payer: Medicaid Other

## 2021-08-28 ENCOUNTER — Other Ambulatory Visit: Payer: Self-pay

## 2021-08-28 ENCOUNTER — Ambulatory Visit: Payer: Medicaid Other | Admitting: *Deleted

## 2021-08-28 ENCOUNTER — Ambulatory Visit (HOSPITAL_BASED_OUTPATIENT_CLINIC_OR_DEPARTMENT_OTHER): Payer: Medicaid Other | Admitting: Obstetrics

## 2021-08-28 VITALS — BP 97/73 | HR 81

## 2021-08-28 DIAGNOSIS — Z3A18 18 weeks gestation of pregnancy: Secondary | ICD-10-CM | POA: Diagnosis present

## 2021-08-28 DIAGNOSIS — O280 Abnormal hematological finding on antenatal screening of mother: Secondary | ICD-10-CM

## 2021-08-28 DIAGNOSIS — Z363 Encounter for antenatal screening for malformations: Secondary | ICD-10-CM | POA: Diagnosis present

## 2021-08-28 NOTE — Progress Notes (Signed)
MFM Note  Carrie Dennis was seen in consultation due to an elevated MSAFP of 2.90 MoM.  She reports no complications in her current pregnancy.  She denies any significant past medical or family history.    She had a cell free DNA test that indicated a low risk for trisomy 21, 18, and 13.  A female fetus is predicted.  The fetal growth and amniotic fluid level appeared appropriate for her gestational age.  The patient was advised of the ultrasound findings that failed to reveal an anatomical cause for the increased MSAFP.  There were no sonographic signs of spina bifida or an anterior abdominal wall defect noted today.  She was advised regarding the limitations of ultrasound in the detection of all anomalies and that it will diagnose approximately 90% of neural tube defects.  The association of an elevated MSAFP with placental dysfunction which may manifest later in her pregnancy as fetal growth restriction along with with other adverse pregnancy outcomes such as fetal demise was discussed.  Due to the elevated MSAFP, the patient was offered and declined an amniocentesis today for definitive diagnosis of fetal aneuploidy and spina bifida.  Due to the elevated MSAFP, we will continue to follow her with monthly growth ultrasounds. Due to the elevated MSAFP, she should probably be delivered at around 39 weeks.    A total of 30 minutes was spent counseling and coordinating the care for this patient.  Greater than 50% of the time was spent in direct face-to-face contact.   Recommendations:  Monthly growth ultrasounds Delivery at around 39 weeks should the fetal growth be within normal limits

## 2021-08-28 NOTE — Progress Notes (Signed)
Carrie Dennis was scheduled for genetic counseling on 08/28/2021 to discuss her elevated MSAFP screen. Alexanderia declined genetic counseling today after discussing her ultrasound results with Dr. Parke Poisson. Please see separate report for details.

## 2021-09-01 ENCOUNTER — Other Ambulatory Visit: Payer: Self-pay | Admitting: *Deleted

## 2021-09-01 DIAGNOSIS — R772 Abnormality of alphafetoprotein: Secondary | ICD-10-CM

## 2021-09-25 ENCOUNTER — Ambulatory Visit: Payer: Medicaid Other | Attending: Obstetrics

## 2021-09-25 ENCOUNTER — Other Ambulatory Visit: Payer: Self-pay

## 2021-09-25 ENCOUNTER — Other Ambulatory Visit: Payer: Self-pay | Admitting: *Deleted

## 2021-09-25 ENCOUNTER — Encounter: Payer: Self-pay | Admitting: *Deleted

## 2021-09-25 ENCOUNTER — Ambulatory Visit: Payer: Medicaid Other | Admitting: *Deleted

## 2021-09-25 VITALS — BP 113/61 | HR 87

## 2021-09-25 DIAGNOSIS — O288 Other abnormal findings on antenatal screening of mother: Secondary | ICD-10-CM | POA: Diagnosis not present

## 2021-09-25 DIAGNOSIS — R772 Abnormality of alphafetoprotein: Secondary | ICD-10-CM

## 2021-09-25 DIAGNOSIS — Z3A22 22 weeks gestation of pregnancy: Secondary | ICD-10-CM | POA: Diagnosis not present

## 2021-11-04 ENCOUNTER — Ambulatory Visit: Payer: Medicaid Other | Attending: Obstetrics and Gynecology

## 2021-11-04 ENCOUNTER — Ambulatory Visit: Payer: Medicaid Other | Admitting: *Deleted

## 2021-11-04 ENCOUNTER — Other Ambulatory Visit: Payer: Self-pay | Admitting: *Deleted

## 2021-11-04 ENCOUNTER — Other Ambulatory Visit: Payer: Self-pay

## 2021-11-04 VITALS — BP 105/63 | HR 84

## 2021-11-04 DIAGNOSIS — Z3689 Encounter for other specified antenatal screening: Secondary | ICD-10-CM | POA: Diagnosis present

## 2021-11-04 DIAGNOSIS — R772 Abnormality of alphafetoprotein: Secondary | ICD-10-CM

## 2021-11-04 DIAGNOSIS — Z362 Encounter for other antenatal screening follow-up: Secondary | ICD-10-CM | POA: Diagnosis not present

## 2021-11-04 DIAGNOSIS — Z3A28 28 weeks gestation of pregnancy: Secondary | ICD-10-CM | POA: Diagnosis not present

## 2021-11-16 NOTE — L&D Delivery Note (Signed)
Operative Delivery Note ? ? ?Pt reached complete dilation and pushed well for about an hour making slow and steady progress.  After delivery of the head a shoulder dystocia was encountered and was relieved after one minute with McRoberts and posterior shoulder corkscrew maneuver.  Baby moving both arms well after delivery.  At 6:14 PM a healthy female was delivered via  Presentation: vertex; Position: Left,, Occiput,, Anterior; Station: +3.   ? ? ?APGAR: 8, 9; weight pending .   ?Placenta status: some delay in delivery, but eventually spontaneously delivered withy ptiocin and had a succenturiate lobe.   ?Cord:  with the following complications: none.   ? ?Anesthesia:  epidural ?Episiotomy:  none ?Lacerations:  none ? ?Est. Blood Loss (mL):  ? ?Mom to postpartum.  Baby to Couplet care / Skin to Skin. ? ?D/w pt circumcision and they desire to proceed. ? ?Carrie Dennis ?01/21/2022, 6:59 PM ? ?  ?

## 2021-11-26 ENCOUNTER — Encounter: Payer: Medicaid Other | Attending: Obstetrics and Gynecology | Admitting: Registered"

## 2021-11-26 ENCOUNTER — Other Ambulatory Visit: Payer: Self-pay

## 2021-11-26 ENCOUNTER — Encounter: Payer: Self-pay | Admitting: Registered"

## 2021-11-26 DIAGNOSIS — O24419 Gestational diabetes mellitus in pregnancy, unspecified control: Secondary | ICD-10-CM | POA: Diagnosis present

## 2021-11-26 NOTE — Progress Notes (Signed)
Patient was seen on 11/26/21 for Gestational Diabetes self-management class at the [facility]. The following learning objectives were met by the patient during this course:  States the definition of Gestational Diabetes States why dietary management is important in controlling blood glucose Describes the effects each nutrient has on blood glucose levels Demonstrates ability to create a balanced meal plan Demonstrates carbohydrate counting  States when to check blood glucose levels Demonstrates proper blood glucose monitoring techniques States the effect of stress and exercise on blood glucose levels States the importance of limiting caffeine and abstaining from alcohol and smoking  Blood glucose monitor given: Accu-chek Guide Me Lot #[id number] Exp: 12/26/2022 CBG: 136 mg/dL  Patient instructed to monitor glucose levels: FBS: 60 - <95; 1 hour: <140; 2 hour: <120  Patient received handouts: Nutrition Diabetes and Pregnancy, including carb counting list  Patient will be seen for follow-up as needed.

## 2021-12-05 ENCOUNTER — Ambulatory Visit (HOSPITAL_BASED_OUTPATIENT_CLINIC_OR_DEPARTMENT_OTHER): Payer: Medicaid Other

## 2021-12-05 ENCOUNTER — Other Ambulatory Visit: Payer: Self-pay | Admitting: Maternal & Fetal Medicine

## 2021-12-05 ENCOUNTER — Other Ambulatory Visit: Payer: Self-pay

## 2021-12-05 ENCOUNTER — Ambulatory Visit: Payer: Medicaid Other | Attending: Maternal & Fetal Medicine | Admitting: *Deleted

## 2021-12-05 ENCOUNTER — Ambulatory Visit: Payer: Medicaid Other

## 2021-12-05 ENCOUNTER — Encounter: Payer: Self-pay | Admitting: *Deleted

## 2021-12-05 ENCOUNTER — Other Ambulatory Visit: Payer: Self-pay | Admitting: *Deleted

## 2021-12-05 VITALS — BP 126/64 | HR 93

## 2021-12-05 DIAGNOSIS — Z3A32 32 weeks gestation of pregnancy: Secondary | ICD-10-CM | POA: Diagnosis not present

## 2021-12-05 DIAGNOSIS — O289 Unspecified abnormal findings on antenatal screening of mother: Secondary | ICD-10-CM | POA: Diagnosis present

## 2021-12-05 DIAGNOSIS — O24415 Gestational diabetes mellitus in pregnancy, controlled by oral hypoglycemic drugs: Secondary | ICD-10-CM | POA: Insufficient documentation

## 2021-12-05 DIAGNOSIS — Z7984 Long term (current) use of oral hypoglycemic drugs: Secondary | ICD-10-CM | POA: Insufficient documentation

## 2021-12-05 DIAGNOSIS — R772 Abnormality of alphafetoprotein: Secondary | ICD-10-CM

## 2021-12-05 DIAGNOSIS — O24419 Gestational diabetes mellitus in pregnancy, unspecified control: Secondary | ICD-10-CM

## 2021-12-05 DIAGNOSIS — Z3689 Encounter for other specified antenatal screening: Secondary | ICD-10-CM

## 2021-12-29 LAB — OB RESULTS CONSOLE GBS: GBS: NEGATIVE

## 2022-01-02 ENCOUNTER — Ambulatory Visit: Payer: Medicaid Other | Admitting: *Deleted

## 2022-01-02 ENCOUNTER — Ambulatory Visit: Payer: Medicaid Other | Attending: Maternal & Fetal Medicine

## 2022-01-02 ENCOUNTER — Other Ambulatory Visit: Payer: Self-pay

## 2022-01-02 VITALS — BP 111/68 | HR 88

## 2022-01-02 DIAGNOSIS — O24415 Gestational diabetes mellitus in pregnancy, controlled by oral hypoglycemic drugs: Secondary | ICD-10-CM | POA: Diagnosis present

## 2022-01-02 DIAGNOSIS — O24419 Gestational diabetes mellitus in pregnancy, unspecified control: Secondary | ICD-10-CM | POA: Insufficient documentation

## 2022-01-02 DIAGNOSIS — Z3A36 36 weeks gestation of pregnancy: Secondary | ICD-10-CM

## 2022-01-07 ENCOUNTER — Encounter (HOSPITAL_COMMUNITY): Payer: Self-pay | Admitting: Obstetrics and Gynecology

## 2022-01-07 ENCOUNTER — Telehealth (HOSPITAL_COMMUNITY): Payer: Self-pay | Admitting: *Deleted

## 2022-01-07 NOTE — Telephone Encounter (Signed)
Preadmission screen  

## 2022-01-12 ENCOUNTER — Telehealth (HOSPITAL_COMMUNITY): Payer: Self-pay | Admitting: *Deleted

## 2022-01-12 NOTE — Telephone Encounter (Signed)
Preadmission screen  

## 2022-01-13 ENCOUNTER — Telehealth (HOSPITAL_COMMUNITY): Payer: Self-pay | Admitting: *Deleted

## 2022-01-13 ENCOUNTER — Encounter (HOSPITAL_COMMUNITY): Payer: Self-pay | Admitting: *Deleted

## 2022-01-13 NOTE — Telephone Encounter (Signed)
Preadmission screen  

## 2022-01-19 ENCOUNTER — Other Ambulatory Visit: Payer: Self-pay

## 2022-01-20 ENCOUNTER — Other Ambulatory Visit: Payer: Self-pay

## 2022-01-20 ENCOUNTER — Inpatient Hospital Stay (HOSPITAL_COMMUNITY): Payer: Medicaid Other

## 2022-01-20 ENCOUNTER — Inpatient Hospital Stay (HOSPITAL_COMMUNITY)
Admission: AD | Admit: 2022-01-20 | Discharge: 2022-01-23 | DRG: 807 | Disposition: A | Discharge status: Home / Self Care | Payer: Medicaid Other | Attending: Obstetrics and Gynecology | Admitting: Obstetrics and Gynecology

## 2022-01-20 ENCOUNTER — Encounter (HOSPITAL_COMMUNITY): Payer: Self-pay | Admitting: Obstetrics and Gynecology

## 2022-01-20 DIAGNOSIS — Z20822 Contact with and (suspected) exposure to covid-19: Secondary | ICD-10-CM | POA: Diagnosis present

## 2022-01-20 DIAGNOSIS — O24425 Gestational diabetes mellitus in childbirth, controlled by oral hypoglycemic drugs: Principal | ICD-10-CM | POA: Diagnosis present

## 2022-01-20 DIAGNOSIS — O24419 Gestational diabetes mellitus in pregnancy, unspecified control: Principal | ICD-10-CM | POA: Diagnosis present

## 2022-01-20 DIAGNOSIS — Z3A39 39 weeks gestation of pregnancy: Secondary | ICD-10-CM

## 2022-01-20 HISTORY — DX: Tubulo-interstitial nephritis, not specified as acute or chronic: N12

## 2022-01-20 LAB — TYPE AND SCREEN
ABO/RH(D): O POS
Antibody Screen: NEGATIVE

## 2022-01-20 LAB — CBC
HCT: 34.2 % — ABNORMAL LOW (ref 36.0–46.0)
Hemoglobin: 11.3 g/dL — ABNORMAL LOW (ref 12.0–15.0)
MCH: 28.8 pg (ref 26.0–34.0)
MCHC: 33 g/dL (ref 30.0–36.0)
MCV: 87.2 fL (ref 80.0–100.0)
Platelets: 335 10*3/uL (ref 150–400)
RBC: 3.92 MIL/uL (ref 3.87–5.11)
RDW: 15.3 % (ref 11.5–15.5)
WBC: 14.4 10*3/uL — ABNORMAL HIGH (ref 4.0–10.5)
nRBC: 0 % (ref 0.0–0.2)

## 2022-01-20 LAB — RESP PANEL BY RT-PCR (FLU A&B, COVID) ARPGX2
Influenza A by PCR: NEGATIVE
Influenza B by PCR: NEGATIVE
SARS Coronavirus 2 by RT PCR: NEGATIVE

## 2022-01-20 LAB — RPR: RPR Ser Ql: NONREACTIVE

## 2022-01-20 LAB — GLUCOSE, CAPILLARY
Glucose-Capillary: 110 mg/dL — ABNORMAL HIGH (ref 70–99)
Glucose-Capillary: 113 mg/dL — ABNORMAL HIGH (ref 70–99)
Glucose-Capillary: 127 mg/dL — ABNORMAL HIGH (ref 70–99)
Glucose-Capillary: 132 mg/dL — ABNORMAL HIGH (ref 70–99)
Glucose-Capillary: 78 mg/dL (ref 70–99)
Glucose-Capillary: 89 mg/dL (ref 70–99)

## 2022-01-20 MED ORDER — TERBUTALINE SULFATE 1 MG/ML IJ SOLN
0.2500 mg | Freq: Once | INTRAMUSCULAR | Status: DC | PRN
Start: 2022-01-20 — End: 2022-01-21

## 2022-01-20 MED ORDER — OXYCODONE-ACETAMINOPHEN 5-325 MG PO TABS: 2.0000 | ORAL_TABLET | ORAL | Status: DC | PRN

## 2022-01-20 MED ORDER — OXYCODONE-ACETAMINOPHEN 5-325 MG PO TABS: 1.0000 | ORAL_TABLET | ORAL | Status: DC | PRN

## 2022-01-20 MED ORDER — FENTANYL-BUPIVACAINE-NACL 0.5-0.125-0.9 MG/250ML-% EP SOLN
12.0000 mL/h | EPIDURAL | Status: DC | PRN
  Administered 2022-01-21: 12 mL/h via EPIDURAL
  Filled 2022-01-20: qty 250

## 2022-01-20 MED ORDER — OXYTOCIN BOLUS FROM INFUSION
333.0000 mL | Freq: Once | INTRAVENOUS | Status: AC
  Administered 2022-01-21: 333 mL via INTRAVENOUS

## 2022-01-20 MED ORDER — OXYTOCIN-SODIUM CHLORIDE 30-0.9 UT/500ML-% IV SOLN
2.5000 [IU]/h | INTRAVENOUS | Status: DC
  Administered 2022-01-21: 2.5 [IU]/h via INTRAVENOUS

## 2022-01-20 MED ORDER — INSULIN ASPART 100 UNIT/ML IJ SOLN
0.0000 [IU] | INTRAMUSCULAR | Status: DC
  Administered 2022-01-20 – 2022-01-21 (×2): 1 [IU] via SUBCUTANEOUS

## 2022-01-20 MED ORDER — ACETAMINOPHEN 325 MG PO TABS: 650.0000 mg | ORAL_TABLET | ORAL | Status: DC | PRN

## 2022-01-20 MED ORDER — LACTATED RINGERS IV SOLN
500.0000 mL | Freq: Once | INTRAVENOUS | Status: AC
  Administered 2022-01-21: 500 mL via INTRAVENOUS

## 2022-01-20 MED ORDER — EPHEDRINE 5 MG/ML INJ: 10.0000 mg | INTRAVENOUS | Status: DC | PRN

## 2022-01-20 MED ORDER — ONDANSETRON HCL 4 MG/2ML IJ SOLN
4.0000 mg | Freq: Four times a day (QID) | INTRAMUSCULAR | Status: DC | PRN
  Administered 2022-01-21: 4 mg via INTRAVENOUS
  Filled 2022-01-20: qty 2

## 2022-01-20 MED ORDER — LACTATED RINGERS IV SOLN: INTRAVENOUS | Status: DC

## 2022-01-20 MED ORDER — TERBUTALINE SULFATE 1 MG/ML IJ SOLN: 0.2500 mg | Freq: Once | INTRAMUSCULAR | Status: DC | PRN

## 2022-01-20 MED ORDER — LACTATED RINGERS IV SOLN
500.0000 mL | INTRAVENOUS | Status: DC | PRN
  Administered 2022-01-21: 500 mL via INTRAVENOUS

## 2022-01-20 MED ORDER — LIDOCAINE HCL (PF) 1 % IJ SOLN: 30.0000 mL | INTRAMUSCULAR | Status: DC | PRN

## 2022-01-20 MED ORDER — MISOPROSTOL 25 MCG QUARTER TABLET
25.0000 ug | ORAL_TABLET | ORAL | Status: DC | PRN
  Administered 2022-01-20 (×3): 25 ug via VAGINAL
  Filled 2022-01-20 (×4): qty 1

## 2022-01-20 MED ORDER — PHENYLEPHRINE 40 MCG/ML (10ML) SYRINGE FOR IV PUSH (FOR BLOOD PRESSURE SUPPORT)
80.0000 ug | PREFILLED_SYRINGE | INTRAVENOUS | Status: DC | PRN
  Filled 2022-01-20: qty 10

## 2022-01-20 MED ORDER — OXYTOCIN-SODIUM CHLORIDE 30-0.9 UT/500ML-% IV SOLN
1.0000 m[IU]/min | INTRAVENOUS | Status: DC
  Administered 2022-01-20: 2 m[IU]/min via INTRAVENOUS
  Filled 2022-01-20 (×2): qty 500

## 2022-01-20 MED ORDER — SOD CITRATE-CITRIC ACID 500-334 MG/5ML PO SOLN
30.0000 mL | ORAL | Status: DC | PRN
  Administered 2022-01-20 (×2): 30 mL via ORAL
  Filled 2022-01-20 (×2): qty 30

## 2022-01-20 MED ORDER — PHENYLEPHRINE 40 MCG/ML (10ML) SYRINGE FOR IV PUSH (FOR BLOOD PRESSURE SUPPORT): 80.0000 ug | PREFILLED_SYRINGE | INTRAVENOUS | Status: DC | PRN

## 2022-01-20 MED ORDER — MISOPROSTOL 50MCG HALF TABLET
50.0000 ug | ORAL_TABLET | ORAL | Status: DC | PRN
  Administered 2022-01-20 (×2): 50 ug via BUCCAL
  Filled 2022-01-20 (×2): qty 1

## 2022-01-20 NOTE — Progress Notes (Signed)
Patient comfortable but feeling contractions and starting to think about epidural.  Pt had wanted to avoid foley bulb, but is now ok with proceeding. ?FHT:130 mod var +accels no decels ?TOCO: 2-5 ?SVE: 11/20/48/-3 ?A/P ?Foley bulb placed, start pit 2x2 ?Other routine care. ?

## 2022-01-20 NOTE — Progress Notes (Signed)
Pt feeling more contractions with buccal cytotec. Desiring meal. ?FHT: 140 mod var +accels no decels ?TOCO: 2-4 ?SVE: 1.5/Th/-3 ?A/P: ?Okay for one light labor meal, then continue cytotec as needed, pitocin once bishop score >6 ?

## 2022-01-20 NOTE — H&P (Signed)
26 y.o. 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  Otherwise has good fetal movement and no bleeding. ? ?Past Medical History:  ?Diagnosis Date  ? Anxiety   ? Depression   ? Gestational diabetes   ? Headache   ? PONV (postoperative nausea and vomiting)   ? Pyelonephritis   ? Sleep apnea   ?  ?Past Surgical History:  ?Procedure Laterality Date  ? ADENOIDECTOMY    ? TONSILLECTOMY    ?  ?OB History  ?Gravida Para Term Preterm AB Living  ?4 1 1   2 1   ?SAB IAB Ectopic Multiple Live Births  ?1 1   0 1  ?  ?# Outcome Date GA Lbr Len/2nd Weight Sex Delivery Anes PTL Lv  ?4 Current           ?3 Term 10/28/19 [redacted]w[redacted]d 16:51 / 01:21 3625 g F Vag-Spont EPI  LIV  ?2 IAB 2013 [redacted]w[redacted]d         ?1 SAB           ?  ?Social History  ? ?Socioeconomic History  ? Marital status: Single  ?  Spouse name: Not on file  ? Number of children: Not on file  ? Years of education: Not on file  ? Highest education level: Not on file  ?Occupational History  ? Not on file  ?Tobacco Use  ? Smoking status: Never  ? Smokeless tobacco: Never  ?Vaping Use  ? Vaping Use: Former  ? Quit date: 07/30/2021  ? Substances: Nicotine, THC, Flavoring  ?Substance and Sexual Activity  ? Alcohol use: No  ? Drug use: Yes  ?  Types: Marijuana  ?  Comment: last used around July 2021  ? Sexual activity: Yes  ?Other Topics Concern  ? Not on file  ?Social History Narrative  ? Not on file  ? ?Social Determinants of Health  ? ?Financial Resource Strain: Not on file  ?Food Insecurity: No Food Insecurity  ? Worried About August 2021 in the Last Year: Never true  ? Ran Out of Food in the Last Year: Never true  ?Transportation Needs: Not on file  ?Physical Activity: Not on file  ?Stress: Not on file  ?Social Connections: Not on file  ?Intimate Partner Violence: Not on file  ? Benadryl [diphenhydramine hcl], Penicillins, Sulfa antibiotics, and Neomycin-polymyxin b gu  ? ? ?Prenatal Transfer Tool  ?Maternal Diabetes: Yes:  Diabetes Type:   Insulin/Medication controlled ?Genetic Screening: Abnormal:  Results: Other: elevated MOM 2.9 ?Maternal Ultrasounds/Referrals: Normal ?Fetal Ultrasounds or other Referrals:  Referred to Materal Fetal Medicine  normal, no sign of NTD ?Maternal Substance Abuse:  No ?Significant Maternal Medications:  Meds include: Other: metformin 500bid ?Significant Maternal Lab Results: Group B Strep negative ? ?Other PNC: GMDA2 on metformin, last growth scan 37weeks: 7#4 (84%) ? ? ? ?Vitals:  ? 01/20/22 0050 01/20/22 0051 01/20/22 0537 01/20/22 03/22/22  ?BP:  101/60 105/66 (!) 93/51  ?Pulse:  (!) 105 83 88  ?Resp: 18   18  ?Temp: 98.1 ?F (36.7 ?C)  98.5 ?F (36.9 ?C)   ?TempSrc: Oral  Oral   ?Weight:      ?Height:      ? ? ?Lungs/Cor:  NAD ?Abdomen:  soft, gravid ?Ex:  no cords, erythema ?SVE:  2/Th/-3 ?FHTs:  130, good STV, NST R; Cat 1 tracing. ?Toco:  q q1-4 ? ? ?A/P   Admitted for IOL for GDMA2 ?  Will start insulin protocol monitoring and treatment ? GBS Neg ?Cytotec for cervical ripening, anticipate AROM and Pitocin ? ?Philip Aspen  ?

## 2022-01-21 ENCOUNTER — Encounter (HOSPITAL_COMMUNITY): Payer: Self-pay | Admitting: Obstetrics and Gynecology

## 2022-01-21 ENCOUNTER — Inpatient Hospital Stay (HOSPITAL_COMMUNITY): Payer: Medicaid Other | Admitting: Anesthesiology

## 2022-01-21 LAB — GLUCOSE, CAPILLARY
Glucose-Capillary: 100 mg/dL — ABNORMAL HIGH (ref 70–99)
Glucose-Capillary: 102 mg/dL — ABNORMAL HIGH (ref 70–99)
Glucose-Capillary: 81 mg/dL (ref 70–99)
Glucose-Capillary: 86 mg/dL (ref 70–99)
Glucose-Capillary: 91 mg/dL (ref 70–99)
Glucose-Capillary: 96 mg/dL (ref 70–99)

## 2022-01-21 MED ORDER — SENNOSIDES-DOCUSATE SODIUM 8.6-50 MG PO TABS
2.0000 | ORAL_TABLET | ORAL | Status: DC
  Administered 2022-01-21 – 2022-01-22 (×2): 2 via ORAL
  Filled 2022-01-21 (×2): qty 2

## 2022-01-21 MED ORDER — ONDANSETRON HCL 4 MG/2ML IJ SOLN: 4.0000 mg | INTRAMUSCULAR | Status: DC | PRN

## 2022-01-21 MED ORDER — PRENATAL MULTIVITAMIN CH
1.0000 | ORAL_TABLET | Freq: Every day | ORAL | Status: DC
  Administered 2022-01-22 – 2022-01-23 (×2): 1 via ORAL
  Filled 2022-01-21 (×2): qty 1

## 2022-01-21 MED ORDER — FENTANYL CITRATE (PF) 100 MCG/2ML IJ SOLN
50.0000 ug | INTRAMUSCULAR | Status: DC | PRN
  Administered 2022-01-21 (×3): 50 ug via INTRAVENOUS
  Filled 2022-01-21 (×3): qty 2

## 2022-01-21 MED ORDER — SIMETHICONE 80 MG PO CHEW: 80.0000 mg | CHEWABLE_TABLET | ORAL | Status: DC | PRN

## 2022-01-21 MED ORDER — ACETAMINOPHEN 325 MG PO TABS
650.0000 mg | ORAL_TABLET | ORAL | Status: DC | PRN
  Administered 2022-01-22 – 2022-01-23 (×4): 650 mg via ORAL
  Filled 2022-01-21 (×4): qty 2

## 2022-01-21 MED ORDER — DIBUCAINE (PERIANAL) 1 % EX OINT: 1.0000 "application " | TOPICAL_OINTMENT | CUTANEOUS | Status: DC | PRN

## 2022-01-21 MED ORDER — IBUPROFEN 600 MG PO TABS
600.0000 mg | ORAL_TABLET | Freq: Four times a day (QID) | ORAL | Status: DC
  Administered 2022-01-21 – 2022-01-23 (×7): 600 mg via ORAL
  Filled 2022-01-21 (×8): qty 1

## 2022-01-21 MED ORDER — ZOLPIDEM TARTRATE 5 MG PO TABS: 5.0000 mg | ORAL_TABLET | Freq: Every evening | ORAL | Status: DC | PRN

## 2022-01-21 MED ORDER — TETANUS-DIPHTH-ACELL PERTUSSIS 5-2.5-18.5 LF-MCG/0.5 IM SUSY: 0.5000 mL | PREFILLED_SYRINGE | Freq: Once | INTRAMUSCULAR | Status: DC

## 2022-01-21 MED ORDER — COCONUT OIL OIL: 1.0000 "application " | TOPICAL_OIL | Status: DC | PRN

## 2022-01-21 MED ORDER — LIDOCAINE HCL (PF) 1 % IJ SOLN
INTRAMUSCULAR | Status: DC | PRN
  Administered 2022-01-21: 8 mL via EPIDURAL

## 2022-01-21 MED ORDER — ONDANSETRON HCL 4 MG PO TABS: 4.0000 mg | ORAL_TABLET | ORAL | Status: DC | PRN

## 2022-01-21 MED ORDER — BENZOCAINE-MENTHOL 20-0.5 % EX AERO
1.0000 "application " | INHALATION_SPRAY | CUTANEOUS | Status: DC | PRN
  Filled 2022-01-21: qty 56

## 2022-01-21 MED ORDER — WITCH HAZEL-GLYCERIN EX PADS
1.0000 "application " | MEDICATED_PAD | CUTANEOUS | Status: DC | PRN
  Administered 2022-01-22: 1 via TOPICAL

## 2022-01-21 NOTE — Anesthesia Preprocedure Evaluation (Signed)
Anesthesia Evaluation  ?Patient identified by MRN, date of birth, ID band ?Patient awake ? ? ? ?Reviewed: ?Allergy & Precautions, Patient's Chart, lab work & pertinent test results ? ?History of Anesthesia Complications ?(+) PONV and history of anesthetic complications ? ?Airway ?Mallampati: II ? ?TM Distance: >3 FB ?Neck ROM: Full ? ? ? Dental ?no notable dental hx. ? ?  ?Pulmonary ?sleep apnea ,  ?  ?Pulmonary exam normal ?breath sounds clear to auscultation ? ? ? ? ? ? Cardiovascular ?negative cardio ROS ?Normal cardiovascular exam ?Rhythm:Regular Rate:Normal ? ? ?  ?Neuro/Psych ? Headaches, PSYCHIATRIC DISORDERS Anxiety Depression   ? GI/Hepatic ?negative GI ROS, (+)  ?  ? substance abuse ? marijuana use,   ?Endo/Other  ?negative endocrine ROSdiabetes, Gestational, Oral Hypoglycemic Agents ? Renal/GU ?negative Renal ROS  ?negative genitourinary ?  ?Musculoskeletal ?negative musculoskeletal ROS ?(+)  ? Abdominal ?  ?Peds ?negative pediatric ROS ?(+)  Hematology ?negative hematology ROS ?(+)   ?Anesthesia Other Findings ? ? Reproductive/Obstetrics ?(+) Pregnancy ? ?  ? ? ? ? ? ? ? ? ? ? ? ? ? ?  ?  ? ? ? ? ? ? ? ? ?Anesthesia Physical ?Anesthesia Plan ? ?ASA: 3 ? ?Anesthesia Plan: Epidural  ? ?Post-op Pain Management:   ? ?Induction:  ? ?PONV Risk Score and Plan: 3 and Treatment may vary due to age or medical condition ? ?Airway Management Planned: Natural Airway ? ?Additional Equipment:  ? ?Intra-op Plan:  ? ?Post-operative Plan:  ? ?Informed Consent: I have reviewed the patients History and Physical, chart, labs and discussed the procedure including the risks, benefits and alternatives for the proposed anesthesia with the patient or authorized representative who has indicated his/her understanding and acceptance.  ? ? ? ? ? ?Plan Discussed with: Anesthesiologist ? ?Anesthesia Plan Comments:   ? ? ? ? ? ? ?Anesthesia Quick Evaluation ? ?

## 2022-01-21 NOTE — Progress Notes (Signed)
Patient ID: Carrie Dennis, female   DOB: 03/19/1996, 26 y.o.   MRN: [id number] ?Pt got uncomfortable and recived epidural, some pressure now with contractions and back pain ? ?Cervix 9.5/90/0 ?Feels OP ? ?Placed in exaggerated Sims ?Will try to get baby to rotate ? ? ?

## 2022-01-21 NOTE — Progress Notes (Signed)
Patient ID: Carrie Dennis, female   DOB: 1996-04-09, 26 y.o.   MRN: [id number] ?Pt feeling mild/moderate contractions currently.  Received stadol x 1 ? ?Afeb VSS ?FHR category 1 ? ?Post/5cm/60/-2 ? ?D/w pt would like to proceed with AROm in next couple of hours and that contractions will become more intense thereafter so she may want to proceed with epidural if desired.  She will consider. ?

## 2022-01-21 NOTE — Progress Notes (Signed)
Patient ID: Carrie Dennis, female   DOB: [birth year], 26 y.o.   MRN: [id number] ?Pt decided to wait on epidural for now ? ?Afeb VSS  ?FHR category 1 ? ?Cervix 5/70/-2 posterior ? ?AROM clear ?Will follow progress ?Pitocin at 18 mu ?

## 2022-01-21 NOTE — Progress Notes (Signed)
Foley bulb in place by Dr Claiborne Billings at 2315 on 01/20/22. Foley bulb fell out at 0037 on 01/21/22.  ? ?Patient did not want cervical exam at this time. Patient okay with a cervical check around 6am. ? ? ?Mhopkins RN  ?

## 2022-01-21 NOTE — Anesthesia Procedure Notes (Signed)
Epidural ?Patient location during procedure: OB ?Start time: 01/21/2022 11:20 AM ?End time: 01/21/2022 11:30 AM ? ?Staffing ?Anesthesiologist: Mellody Dance, MD ?Performed: anesthesiologist  ? ?Preanesthetic Checklist ?Completed: patient identified, IV checked, site marked, risks and benefits discussed, monitors and equipment checked, pre-op evaluation and timeout performed ? ?Epidural ?Patient position: sitting ?Prep: DuraPrep ?Patient monitoring: heart rate, cardiac monitor, continuous pulse ox and blood pressure ?Approach: midline ?Location: L2-L3 ?Injection technique: LOR saline ? ?Needle:  ?Needle type: Tuohy  ?Needle gauge: 17 G ?Needle length: 9 cm ?Needle insertion depth: 5 cm ?Catheter type: closed end flexible ?Catheter size: 20 Guage ?Catheter at skin depth: 10 cm ?Test dose: negative and Other ? ?Assessment ?Events: blood not aspirated, injection not painful, no injection resistance and negative IV test ? ?Additional Notes ?Informed consent obtained prior to proceeding including risk of failure, 1% risk of PDPH, risk of minor discomfort and bruising.  Discussed rare but serious complications including epidural abscess, permanent nerve injury, epidural hematoma.  Discussed alternatives to epidural analgesia and patient desires to proceed.  Timeout performed pre-procedure verifying patient name, procedure, and platelet count.  Patient tolerated procedure well. ? ? ? ? ?

## 2022-01-22 LAB — CBC
HCT: 30.9 % — ABNORMAL LOW (ref 36.0–46.0)
Hemoglobin: 10.5 g/dL — ABNORMAL LOW (ref 12.0–15.0)
MCH: 29.3 pg (ref 26.0–34.0)
MCHC: 34 g/dL (ref 30.0–36.0)
MCV: 86.3 fL (ref 80.0–100.0)
Platelets: 277 10*3/uL (ref 150–400)
RBC: 3.58 MIL/uL — ABNORMAL LOW (ref 3.87–5.11)
RDW: 14.9 % (ref 11.5–15.5)
WBC: 18.6 10*3/uL — ABNORMAL HIGH (ref 4.0–10.5)
nRBC: 0 % (ref 0.0–0.2)

## 2022-01-22 LAB — BIRTH TISSUE RECOVERY COLLECTION (PLACENTA DONATION)

## 2022-01-22 NOTE — Anesthesia Postprocedure Evaluation (Signed)
Anesthesia Post Note ? ?Patient: Carrie Dennis ? ?Procedure(s) Performed: AN AD HOC LABOR EPIDURAL ? ?  ? ?Patient location during evaluation: Mother Baby ?Anesthesia Type: Epidural ?Level of consciousness: awake, oriented and awake and alert ?Pain management: pain level controlled ?Vital Signs Assessment: post-procedure vital signs reviewed and stable ?Respiratory status: spontaneous breathing, respiratory function stable and nonlabored ventilation ?Cardiovascular status: stable ?Postop Assessment: adequate PO intake, able to ambulate, patient able to bend at knees and no apparent nausea or vomiting ?Anesthetic complications: no ? ? ?No notable events documented. ? ?Last Vitals:  ?Vitals:  ? 01/22/22 0536 01/22/22 0800  ?BP: 107/67 117/75  ?Pulse: 76 69  ?Resp: 16 18  ?Temp: 37 ?C 36.7 ?C  ?SpO2: 100% 99%  ?  ?Last Pain:  ?Vitals:  ? 01/22/22 0930  ?TempSrc:   ?PainSc: 5   ? ?Pain Goal: Patients Stated Pain Goal: 0 (01/21/22 1105) ? ?  ?  ?  ?  ?  ?  ?  ? ?Carrie Dennis ? ? ? ? ?

## 2022-01-22 NOTE — Clinical Social Work Maternal (Signed)
?CLINICAL SOCIAL WORK MATERNAL/CHILD NOTE ? ?Patient Details  ?Name: Carrie Dennis ?MRN: [id number] ?Date of Birth: 11-14-1996 ? ?Date:  01/22/2022 ? ?Clinical Social Worker Initiating Note:  Kathrin Greathouse, LCSW Date/Time: Initiated:  01/22/22/1035    ? ?Child's Name:  Granville Lewis  ? ?Biological Parents:  Mother, Father  ? ?Need for Interpreter:  None  ? ?Reason for Referral:  Current Substance Use/Substance Use During Pregnancy  , Behavioral Health Concerns  ? ?Address:  [address] ?[place] [address]  ?  ?Phone number:  [phone] (home)    ? ?Additional phone number: ? ?Household Members/Support Persons (HM/SP):   Household Member/Support Person 1, Household Member/Support Person 2 ? ? ?HM/SP Name Relationship DOB or Age  ?HM/SP -1 Geradine Girt Daughter 10-28-2019  ?HM/SP -2 Earma Reading Spouse 06-03-1996  ?HM/SP -3        ?HM/SP -4        ?HM/SP -5        ?HM/SP -6        ?HM/SP -7        ?HM/SP -8        ? ? ?Natural Supports (not living in the home):  Immediate Family  ? ?Professional Supports: None  ? 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 ?Type of Work:    ? ?Education:  High school graduate  ? ?Homebound arranged:   ? ?Financial Resources:  Medicaid  ? ?Other Resources:  Physicist, medical  , [place]  ? ?Cultural/Religious Considerations Which May Impact Care:   ? ?Strengths:  Ability to meet basic needs  , Home prepared for child  , Pediatrician chosen  ? ?Psychotropic Medications:        ? ?Pediatrician:    [place] area ? ?Pediatrician List:  ? ?[facility]  ?[place]    ?[facility]    ?[facility]    ?[facility]    ?[facility]    ? ? ?Pediatrician Fax Number:   ? ?Risk Factors/Current Problems:  Substance Use    ? ?Cognitive State:  Able to Concentrate  , Insightful  , Linear Thinking    ? ?Mood/Affect:  Comfortable  , Calm  , Interested    ? ?CSW Assessment: CSW received consult for hx of Anxiety, Depression and THC use.  CSW met with MOB to offer  support and complete assessment.   ? ?CSW met with MOB at bedside and introduced CSW role. CSW observed MOB lying in the bed and infant receiving Phototherapy treatment while in the bassinet. MOB reported FOB had stepped out to go to the grocery store. MOB presented calm and welcomed CSW visit. MOB confirmed that the demographic information on hospital file is correct. MOB reported she, FOB, and her daughter live together (see chart above).  MOB recognized FOB, her parents, and siblings as a support. MOB reported that she is a stay-at-home mom and FOB is employed as a Biomedical scientist. MOB reported she receives both WIC/FS benefits.  ? ?CSW inquired about MOB mental health history. MOB reported that she was diagnosed with depression and anxiety after puberty. MOB reported having anxiety during the pregnancy because early on she had an ectopic miscarriage so she worried if she would carry the infant to full term. MOB reported the anxiety then induced depression. MOB explain that she was adjusting to being pregnant with a toddler and having a high-risk pregnancy. MOB reported she managed the anxiety by using her coping strategies such as engaging in self-care and binge shopping  and binge watching her favorite shows. She visited with friends and family to get out the house when she had a rough day. MOB reported she also used Delta 8-THC for relaxation purposes. MOB stated she was open with the pediatrician about her use. MOB reported she used every other day during the pregnancy and the last time she used was 4-5 days ago before her scheduled induction. CSW informed MOB about the hospital drug screen policy. MOB made aware that CSW follow the infant's UDS/CDS and make a report to [facility], if warranted. MOB reported that she had [facility] CPS history in 2020 for [facility] use. MOB reported the case in now closed and she denied any other CPS involvement. CSW offered SA resources. MOB declined SA resources.  ? ?CSW  encouraged MOB to continue implementing healthy coping strategies. CSW discussed PPD. MOB shared that she experienced PPD after the birth of her older child as evidenced by her feeling depressed and rage. MOB reported she found it difficult to ask for help especially because it was during the Covid pandemic, and she did not want to invite family over to help with the newborn. MOB reported she felt like she had to be super mom. MOB reported she also had passive suicidal thoughts. MOB reported she took medication briefly which she felt was helpful. MOB reported she has been looking into therapy to help unwind from the events of the pregnancy and now being a mom of two. CSW encouraged MOB to follow up with a therapist. CSW provided education regarding the baby blues period vs. perinatal mood disorders, discussed treatment and gave resources for mental health follow up if concerns arise.  CSW recommended MOB complete a self-evaluation during the postpartum time period using the New Mom Checklist from Postpartum Progress and encouraged MOB to contact a medical professional if symptoms are noted at any time. MOB reported she feels comfortable reaching out to her doctor is she has concerns. CSW assessed MOB for safety. MOB denied thoughts of harm to self and others. MOB denied domestic violence concerns.  ?   ?MOB reported she has essential items for the infant including a bassinet where the infant will sleep. CSW provided review of Sudden Infant Death Syndrome (SIDS) precautions. MOB has chosen [facility] for the infant's follow up care. CSW assessed MOB for additional needs. MOB reported no further need.  ? ?Infant's UDS resulted negative. CSW will continue to follow the infant's CDS and make a report to CPS, if warranted.  ? ?CSW identifies no further need for intervention and no barriers to discharge at this time.  ? ?CSW Plan/Description:  Sudden Infant Death Syndrome (SIDS) Education, CSW Will Continue to  Monitor Umbilical Cord Tissue Drug Screen Results and Make Report if Warranted, [place], Perinatal Mood and Anxiety Disorder (PMADs) Education, No Further Intervention Required/No Barriers to Discharge  ? ? ?Lia Hopping, LCSW ?01/22/2022, 2:22 PM ? ?

## 2022-01-22 NOTE — Progress Notes (Signed)
Patient is eating, ambulating, voiding.  Pain control is good. ? ?Vitals:  ? 01/21/22 2035 01/21/22 2136 01/22/22 0135 01/22/22 0536  ?BP: 137/72 107/68 104/70 107/67  ?Pulse: 84 78  76  ?Resp: 17 18 16 16   ?Temp: 98.1 ?F (36.7 ?C) 98.3 ?F (36.8 ?C) 98.4 ?F (36.9 ?C) 98.6 ?F (37 ?C)  ?TempSrc: Oral Oral Oral Oral  ?SpO2: 100% 100% 99% 100%  ?Weight:      ?Height:      ? ? ?Fundus firm ?Perineum without swelling. ? ?Lab Results  ?Component Value Date  ? WBC 18.6 (H) 01/22/2022  ? HGB 10.5 (L) 01/22/2022  ? HCT 30.9 (L) 01/22/2022  ? MCV 86.3 01/22/2022  ? PLT 277 01/22/2022  ? ? ?--/--/O POS (03/07 0030)/RI ? ?A/P Post partum day 1. ? Routine care.  Expect d/c routine.   ? ?09-19-2002 ?  ?

## 2022-01-23 MED ORDER — IBUPROFEN 800 MG PO TABS: 800.0000 mg | ORAL_TABLET | Freq: Three times a day (TID) | ORAL | 1 refills | Status: AC | PRN

## 2022-01-23 NOTE — Social Work (Signed)
CSW received consult for "Family stressors-Maternal grandparents both overdosed this week.  Mother and Father of baby upset and would like resources for help/counseling."  CSW met with MOB to offer support and complete assessment.   ? ? ?CSW met with MOB at bedside and FOB was present. CSW explained the reason for the visit. MOB presented calm and was appreciative of CSW visit. MOB explained that she and FOB have had some stressors arise, but they are focused on the baby health and possible NICU admission. MOB reported that she is feeling better today. FOB politely interjected and described the events that led to maternal grandparents' overdose. FOB reported that Maternal grandfather uses opioids and had to be revived with chest compressions and is now hospitalized. FOB reported maternal grandmother "took too many pain pills that she takes for back pain." She had to have chest compressions and Narcan given but did not require hospitalization. FOB reported that neither parent died however it was a loss in the form of additional support to their family during this time. CSW provided listening and offered emotional support. CSW offered counseling services and educated them about [employer] [facility] [place]. MOB and FOB were both appreciative to the resources.  ? ?CSW identifies no further need for intervention and no barriers to discharge at this time.  ? ? ?Kathrin Greathouse, MSW, LCSW ?[facility]  ?Clinical Social Worker  ?[phone] ?01/23/2022  11:07 AM  ?

## 2022-01-23 NOTE — Progress Notes (Addendum)
Patient is eating, ambulating, voiding.  Pain control is good. Per peds and NICU, baby is now on bili lights. Concern regarding neonatal fever and tachypnea. Will need to stay in-house, circ is to be deferred ? ?Vitals:  ? 01/22/22 0800 01/22/22 1535 01/22/22 2216 01/23/22 0540  ?BP: 117/75 108/66 92/64 (!) 88/51  ?Pulse: 69 78 77 75  ?Resp: 18 16 16 18   ?Temp: 98 ?F (36.7 ?C) 98 ?F (36.7 ?C) 98 ?F (36.7 ?C) 98.2 ?F (36.8 ?C)  ?TempSrc: Oral  Oral Oral  ?SpO2: 99% 100% 97% 99%  ?Weight:      ?Height:      ? ? ?Fundus firm ?Perineum without swelling. ? ?Lab Results  ?Component Value Date  ? WBC 18.6 (H) 01/22/2022  ? HGB 10.5 (L) 01/22/2022  ? HCT 30.9 (L) 01/22/2022  ? MCV 86.3 01/22/2022  ? PLT 277 01/22/2022  ? ? ?--/--/O POS (03/07 0030)/RI ? ?A/P Post partum day 2. ? Routine care.   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 ?Shyvonne Chastang M Zoeann Mol ?  ?

## 2022-01-23 NOTE — Discharge Summary (Signed)
? ?  Postpartum Discharge Summary ? ?Date of Service updated ? ?   ?Patient Name: Carrie Dennis ?DOB: 09-May-1996 ?MRN: [id number] ? ?Date of admission: 01/20/2022 ?Delivery date:01/21/2022  ?Delivering provider: Huel Cote  ?Date of discharge: 01/23/2022 ? ?Admitting diagnosis: Gestational diabetes [O24.419] ?NSVD (normal spontaneous vaginal delivery) [O80] ?Intrauterine pregnancy: [redacted]w[redacted]d     ?Secondary diagnosis:  Principal Problem: ?  Gestational diabetes ?Active Problems: ?  NSVD (normal spontaneous vaginal delivery) ? ?Additional problems: shoulder dystocia    ?Discharge diagnosis: Term Pregnancy Delivered and GDM A2                                              ?Post partum procedures: none ?Augmentation: AROM, Pitocin, Cytotec, and IP Foley ?Complications: shoulder dystocia (McRoberts, posterior shoulder corkscrew) ? ?Hospital course: Induction of Labor With Vaginal Delivery   ?26 y.o. yo [id number] at [redacted]w[redacted]d was admitted to the hospital 01/20/2022 for induction of labor.  Indication for induction: A2 DM.  Patient had an uncomplicated labor course as follows: ?Membrane Rupture Time/Date: 10:38 AM ,01/21/2022   ?Delivery Method:Vaginal, Spontaneous  ?Episiotomy: None  ?Lacerations:  None  ?Details of delivery can be found in separate delivery note.  Patient had a routine postpartum course. Patient is discharged home 01/23/22. ? ?Newborn Data: ?Birth date:01/21/2022  ?Birth time:6:14 PM  ?Gender:Female  ?Living status:Living  ?Apgars:8 ,9  ?Weight:3980 g  ? ?Physical exam  ?Vitals:  ? 01/22/22 0800 01/22/22 1535 01/22/22 2216 01/23/22 0540  ?BP: 117/75 108/66 92/64 (!) 88/51  ?Pulse: 69 78 77 75  ?Resp: 18 16 16 18   ?Temp: 98 ?F (36.7 ?C) 98 ?F (36.7 ?C) 98 ?F (36.7 ?C) 98.2 ?F (36.8 ?C)  ?TempSrc: Oral  Oral Oral  ?SpO2: 99% 100% 97% 99%  ?Weight:      ?Height:      ? ?General: alert, cooperative, and no distress ?Lochia: appropriate ?Uterine Fundus: firm ?Incision: N/A ?DVT Evaluation: No evidence of DVT seen on  physical exam. ?Negative Homan's sign. ?No cords or calf tenderness. ?Labs: ?Lab Results  ?Component Value Date  ? WBC 18.6 (H) 01/22/2022  ? HGB 10.5 (L) 01/22/2022  ? HCT 30.9 (L) 01/22/2022  ? MCV 86.3 01/22/2022  ? PLT 277 01/22/2022  ? ?CMP Latest Ref Rng & Units 08/28/2020  ?Glucose 70 - 99 mg/dL 93  ?BUN 6 - 20 mg/dL 6  ?Creatinine 0.44 - 1.00 mg/dL 08/30/2020  ?Sodium 135 - 145 mmol/L 136  ?Potassium 3.5 - 5.1 mmol/L 4.1  ?Chloride 98 - 111 mmol/L 103  ?CO2 22 - 32 mmol/L 24  ?Calcium 8.9 - 10.3 mg/dL 9.5  ?Total Protein 6.5 - 8.1 g/dL 7.0  ?Total Bilirubin 0.3 - 1.2 mg/dL 0.6  ?Alkaline Phos 38 - 126 U/L 88  ?AST 15 - 41 U/L 18  ?ALT 0 - 44 U/L 17  ? ?Edinburgh Score: ?Edinburgh Postnatal Depression Scale Screening Tool 01/22/2022  ?I have been able to laugh and see the funny side of things. 0  ?I have looked forward with enjoyment to things. 0  ?I have blamed myself unnecessarily when things went wrong. 1  ?I have been anxious or worried for no good reason. 2  ?I have felt scared or panicky for no good reason. 1  ?Things have been getting on top of me. 1  ?I have been so unhappy that I have had  difficulty sleeping. 0  ?I have felt sad or miserable. 1  ?I have been so unhappy that I have been crying. 0  ?The thought of harming myself has occurred to me. 0  ?Edinburgh Postnatal Depression Scale Total 6  ? ? ? ? ?After visit meds:  ?Allergies as of 01/23/2022   ? ?   Reactions  ? Benadryl [diphenhydramine Hcl] Anaphylaxis  ? Penicillins Anaphylaxis  ? *says only antibiotic agent she can safely take is erythromycin  ? Sulfa Antibiotics Anaphylaxis  ? Neomycin-polymyxin B Gu Rash  ? ?  ? ?  ?Medication List  ?  ? ?STOP taking these medications   ? ?DICLEGIS PO ?  ?metFORMIN 500 MG tablet ?Commonly known as: GLUCOPHAGE ?  ? ?  ? ?TAKE these medications   ? ?cyclobenzaprine 5 MG tablet ?Commonly known as: FLEXERIL ?Take 5 mg by mouth 3 (three) times daily as needed for muscle spasms. ?  ?ibuprofen 800 MG tablet ?Commonly  known as: ADVIL ?Take 1 tablet (800 mg total) by mouth every 8 (eight) hours as needed. ?  ?prenatal multivitamin Tabs tablet ?Take 1 tablet by mouth daily at 12 noon. ?  ? ?  ? ? ? ?Discharge home in stable condition ?Infant Feeding: Bottle and Breast ?Infant Disposition:NICU ?Discharge instruction: per After Visit Summary and Postpartum booklet. ?Activity: Advance as tolerated. Pelvic rest for 6 weeks.  ?Diet: carb modified diet ?Anticipated Birth Control: Unsure ?Postpartum Appointment:6 weeks ?Follow up Visit:[facility]  ? ?01/23/2022 ?Carlisle Cater, MD ? ? ?

## 2022-01-31 ENCOUNTER — Telehealth (HOSPITAL_COMMUNITY): Payer: Self-pay

## 2022-01-31 NOTE — Telephone Encounter (Signed)
No answer. Left message to return nurse call. ? Heron Nay ?01/31/2022,1418 ?

## 2022-07-26 IMAGING — US US OB < 14 WEEKS - US OB TV
1 series · 15 of 28 positions shown · non-contrast
Comparison: None.

CLINICAL DATA: Unsure last menstrual period, vaginal bleeding
today. [id number]

EXAM:
OBSTETRIC <14 WK US AND TRANSVAGINAL OB US
TECHNIQUE: Both transabdominal and transvaginal ultrasound examinations were
performed for complete evaluation of the gestation as well as the
maternal uterus, adnexal regions, and pelvic cul-de-sac.
Transvaginal technique was performed to assess early pregnancy.

[Series 1: us ob < 14 weeks - us ob tv · 49 acquisitions, 15 frames shown]
[im 1/49]
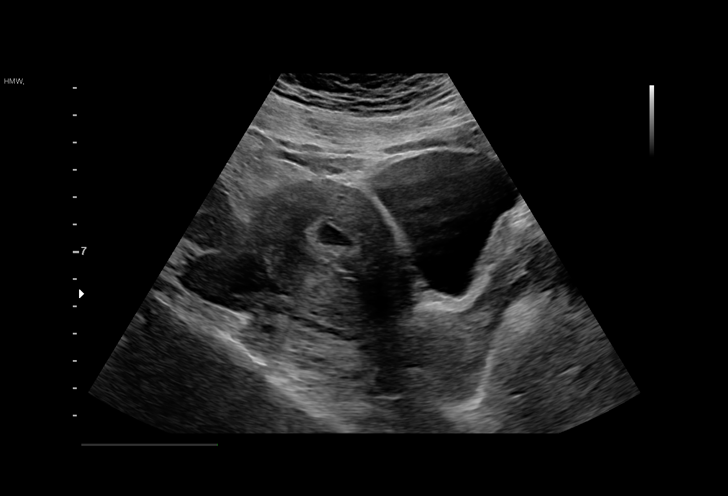
[im 4/49]
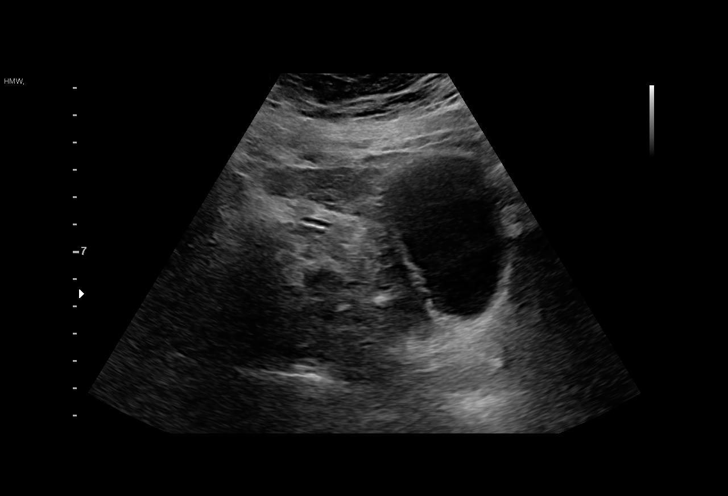
[im 8/49]
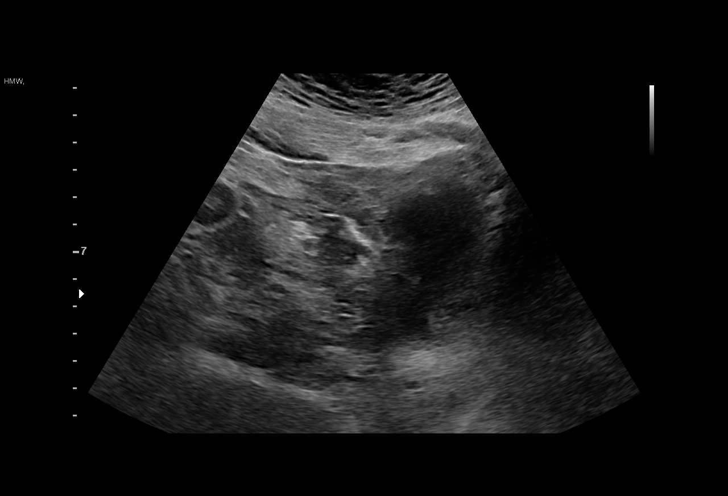
[im 11/49]
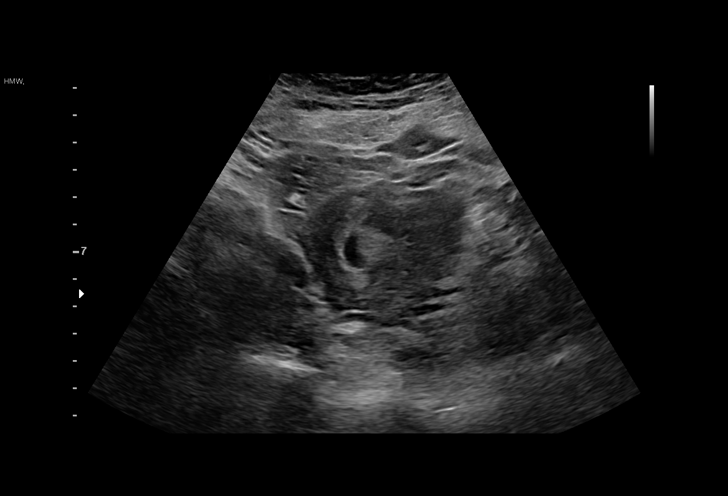
[im 15/49]
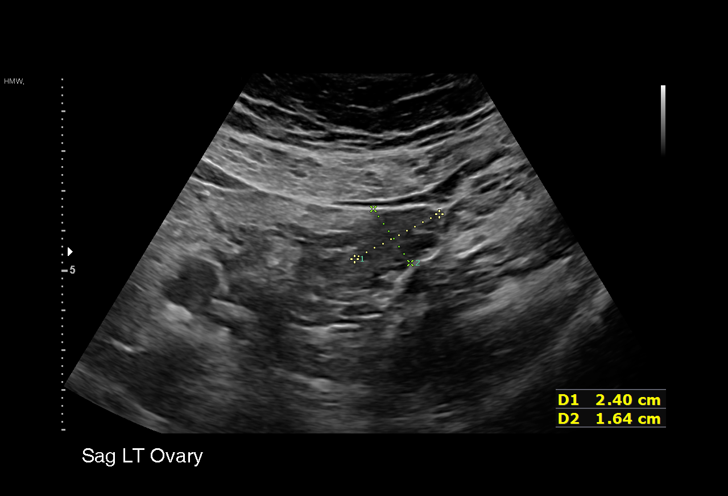
[im 18/49]
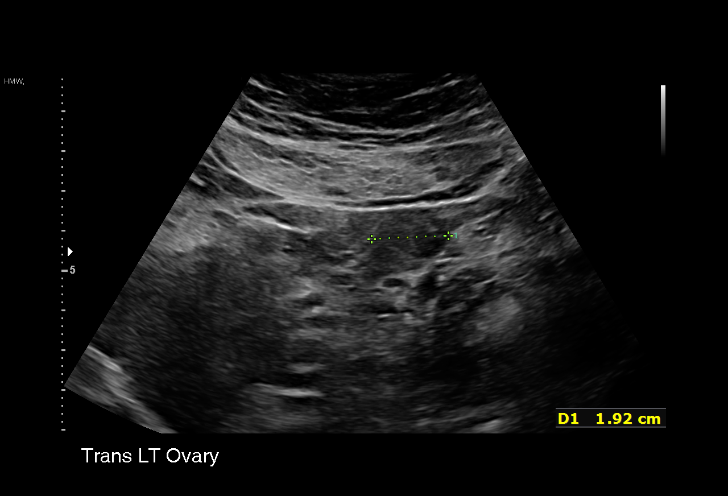
[im 22/49]
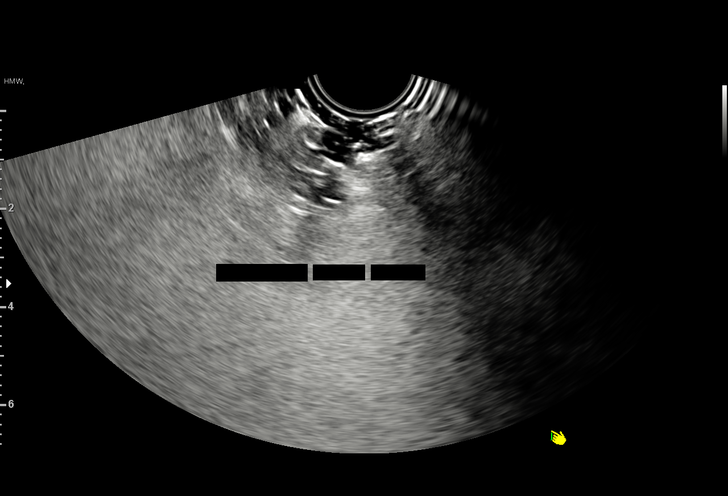
[im 25/49]
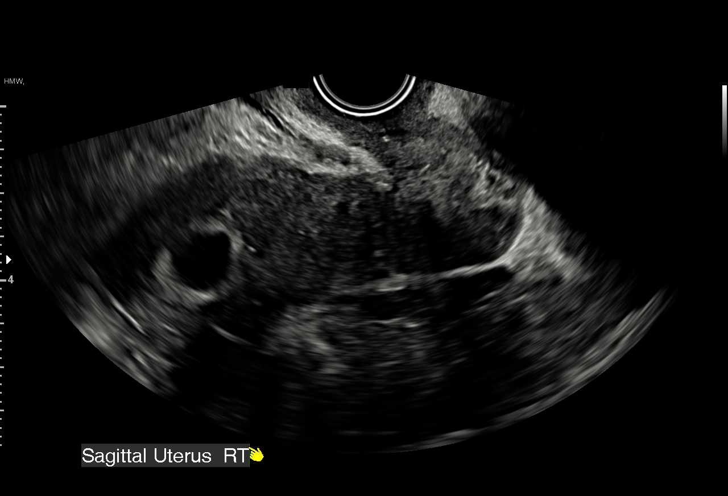
[im 27/49]
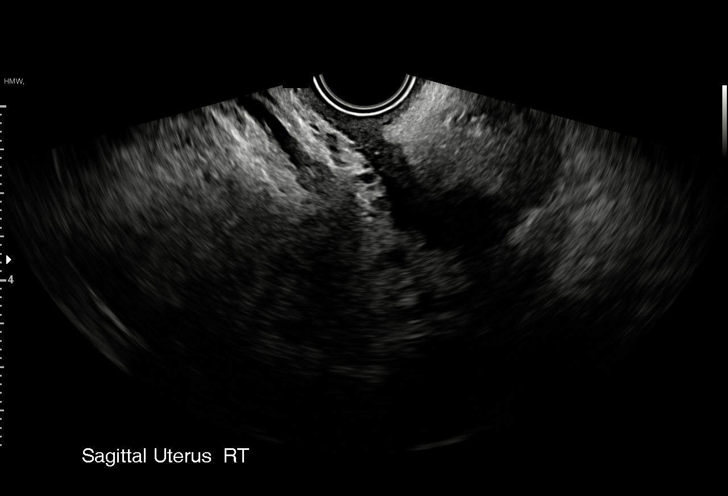
[im 31/49]
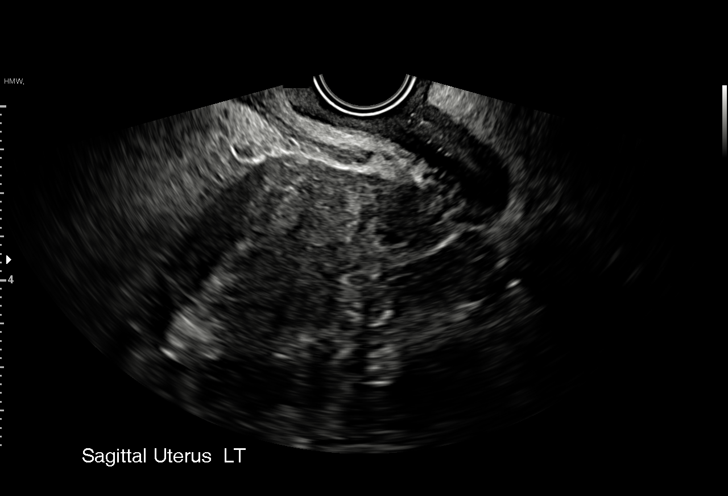
[im 34/49]
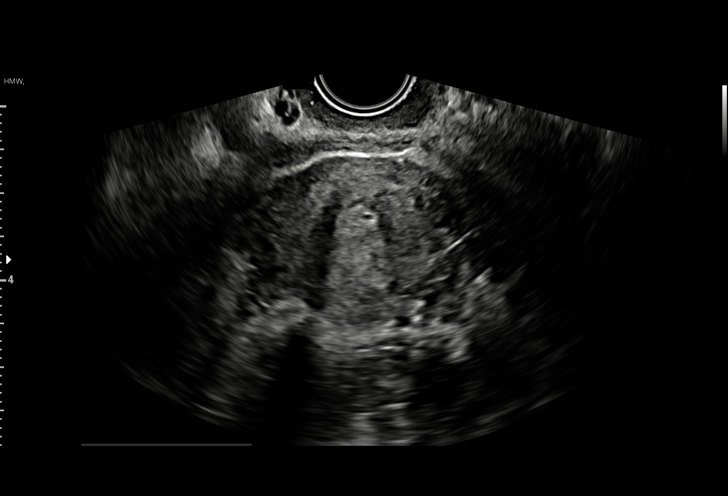
[im 38/49]
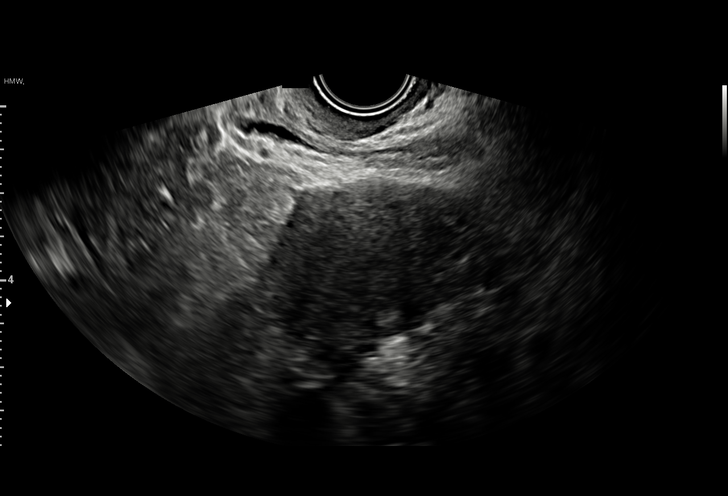
[im 41/49]
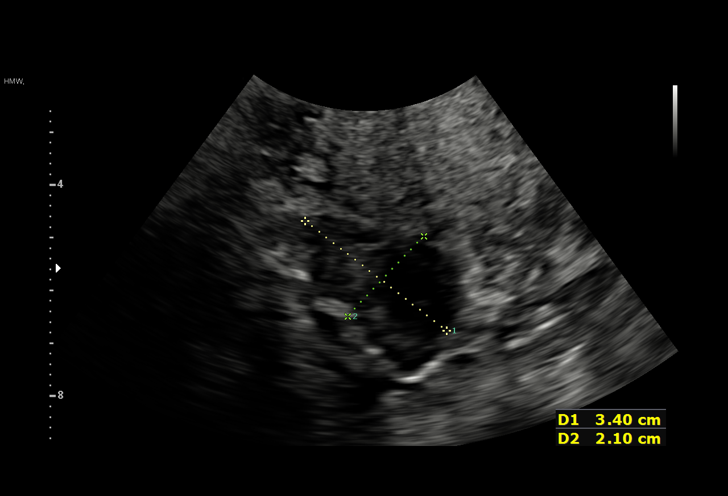
[im 45/49]
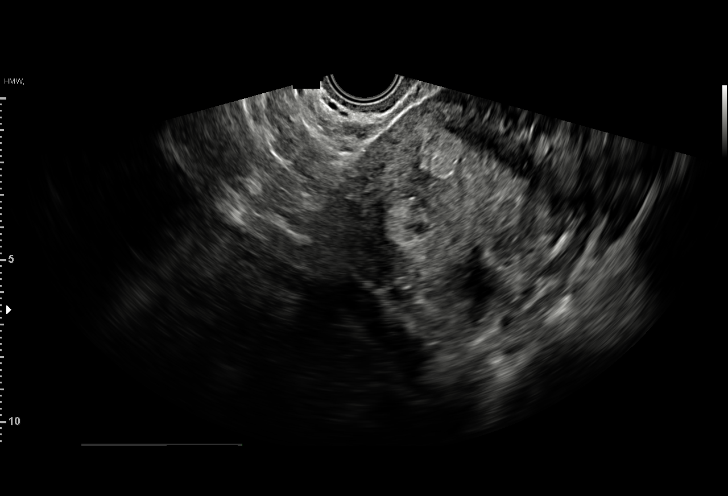
[im 49/49]
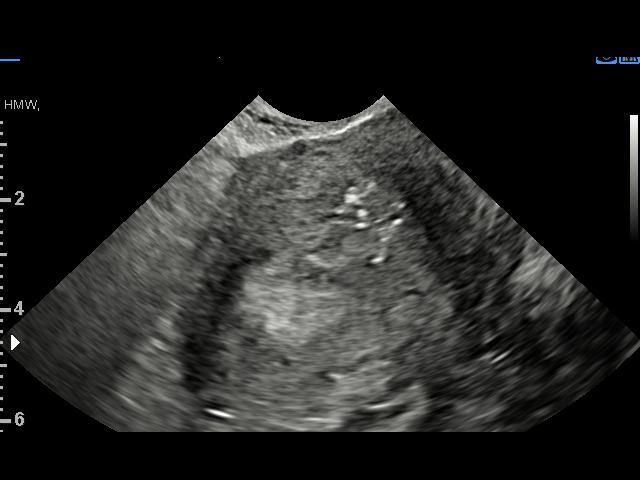

[15 of 28 positions shown; findings below may reference images not displayed]

FINDINGS: Intrauterine gestational sac: Single irregular-shaped gestational
sac.

Yolk sac:  Not Visualized.

Embryo:  Not Visualized.

Cardiac Activity: Not Visualized.

MSD: 18.4 mm   6 w   5 d

Subchorionic hemorrhage:  None visualized.

Maternal uterus/adnexae: The right ovary is normal and only seen on
the transvaginal images. The right ovary measures 3.4 x 2.1 x
cm. The left ovary appears normal and is only seen on the
transabdominal images. The left ovary measures 2.4 x 1.6 x 1.9 cm.
The uterus is otherwise unremarkable.

Other: Trace free fluid within the pelvis.
IMPRESSION: 1. Single irregular shaped intrauterine gestational sac with no yolk
sac, fetal pole, or fetal cardiac activity identified. Findings
likely represents a non-viable intrauterine pregnancy; however, an
early intrauterine pregnancy cannot be fully excluded. Recommend
follow-up quantitative B-HCG levels and follow-up US in 14 days to
assess viability. This recommendation follows SRU consensus
guidelines: Diagnostic Criteria for Nonviable Pregnancy Early in the
First Trimester. N Engl J Med 1591; [DATE].
2. Unremarkable left ovary that is only visualized on transabdominal
images.

## 2022-07-28 IMAGING — US US OB TRANSVAGINAL
1 series · 15 of 28 positions shown · non-contrast
Comparison: None.

CLINICAL DATA: Vaginal bleeding, positive pregnancy test

EXAM:
TRANSVAGINAL OB ULTRASOUND
TECHNIQUE: Transvaginal ultrasound was performed for complete evaluation of the
gestation as well as the maternal uterus, adnexal regions, and
pelvic cul-de-sac.

[Series 1: us ob transvaginal · 34 acquisitions, 15 frames shown]
[im 1/34]
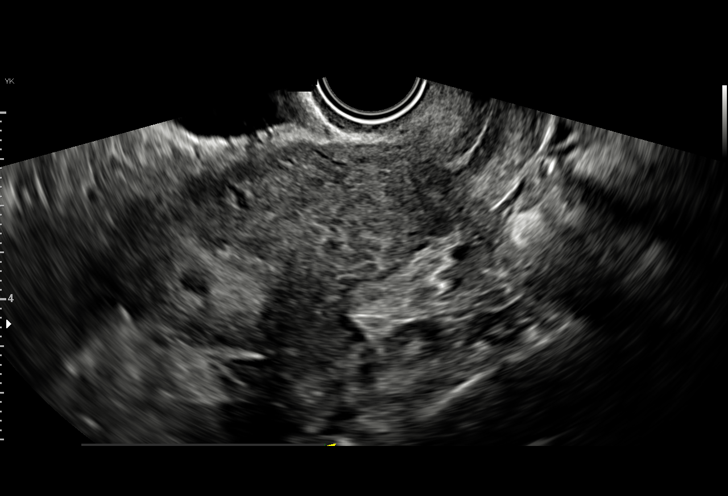
[im 3/34]
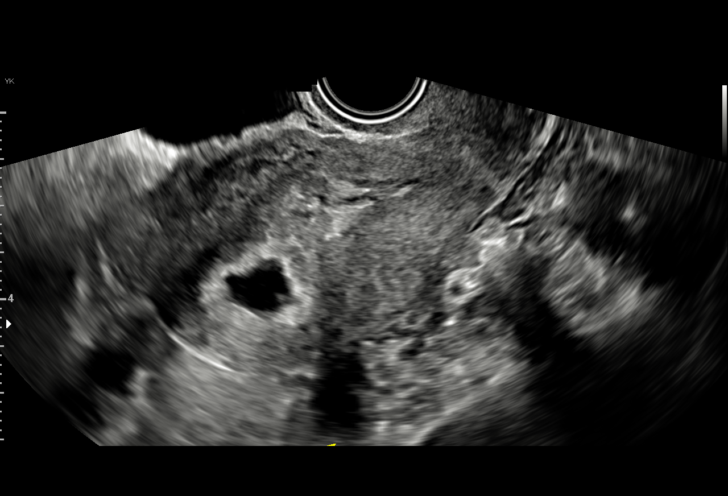
[im 5/34]
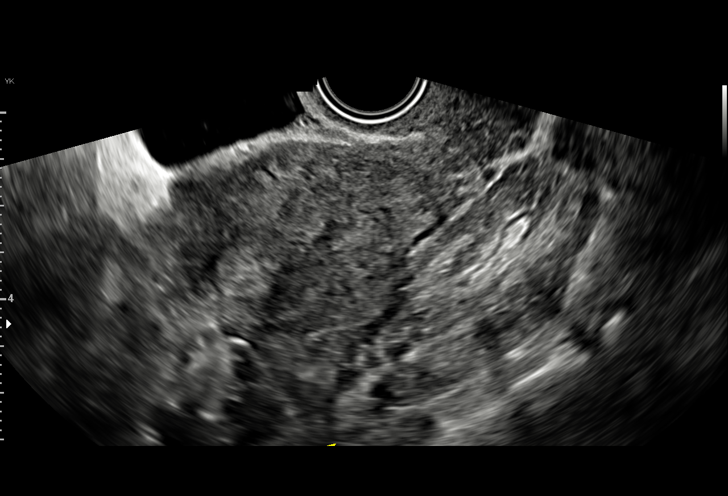
[im 8/34]
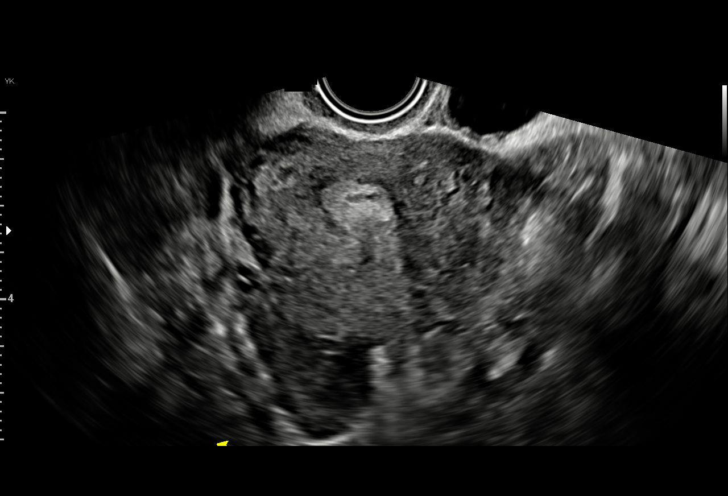
[im 10/34]
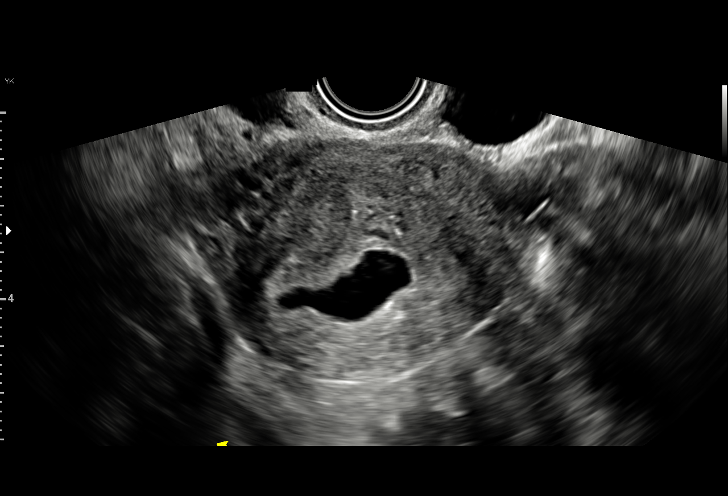
[im 13/34]
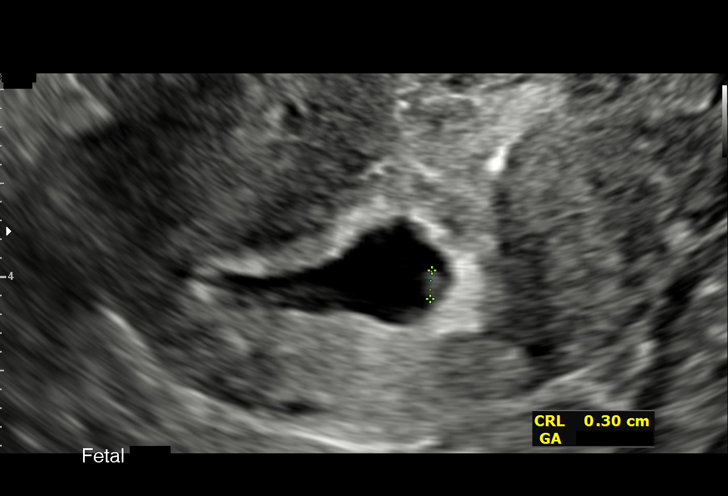
[im 15/34]
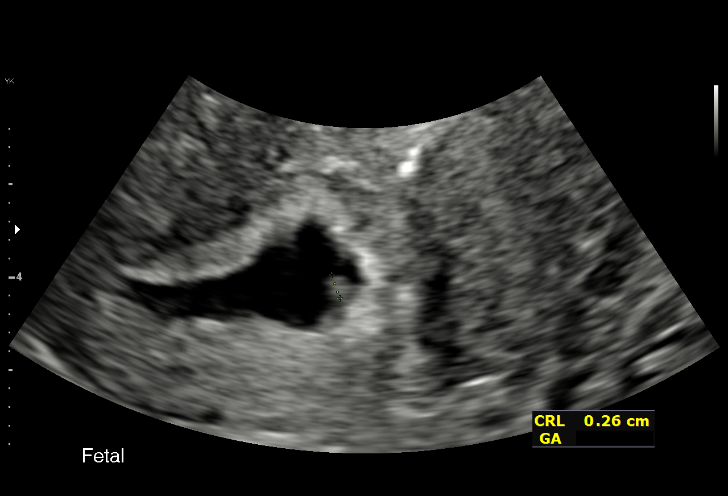
[im 18/34]
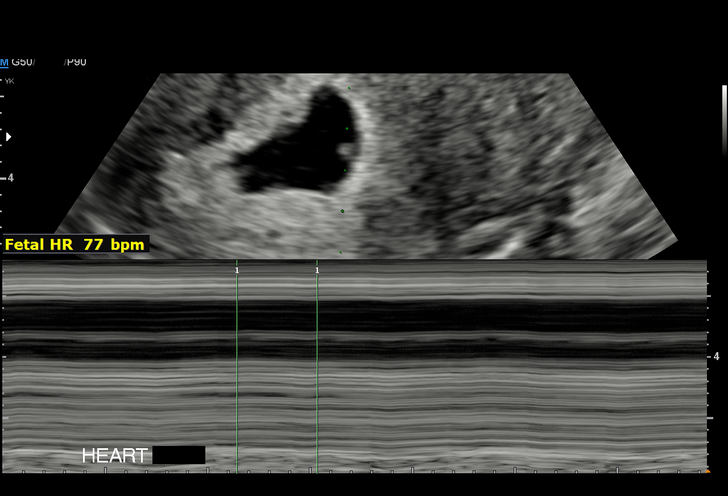
[im 19/34]
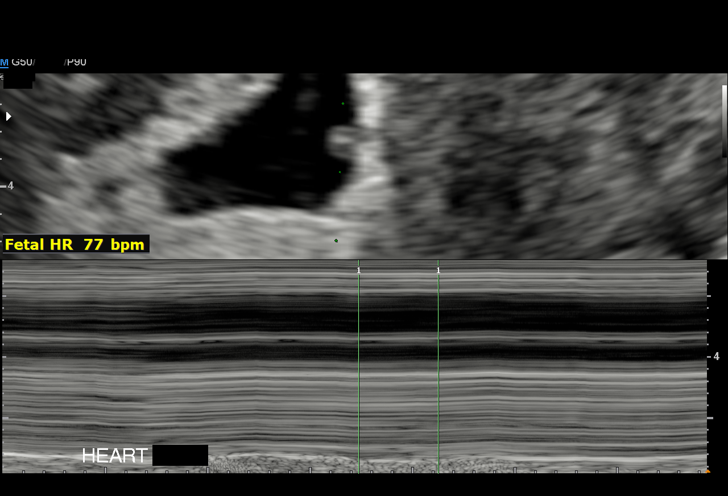
[im 21/34]
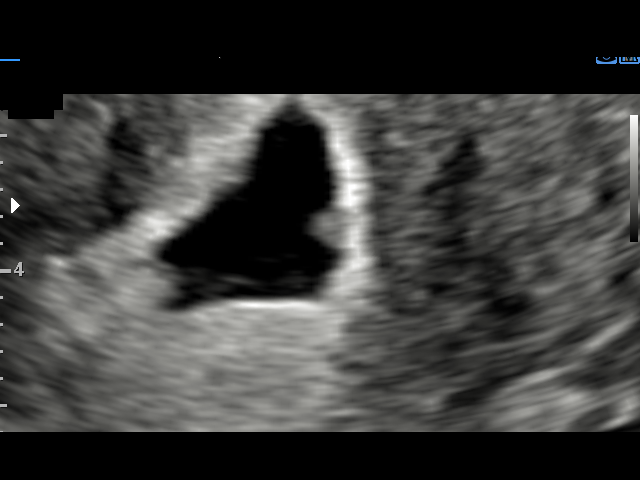
[im 24/34]
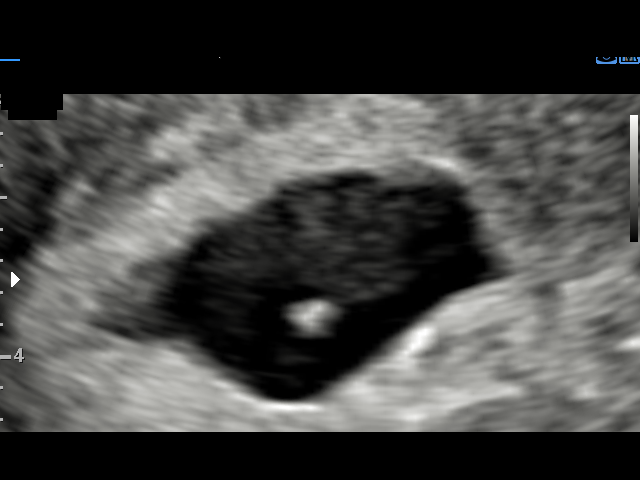
[im 26/34]
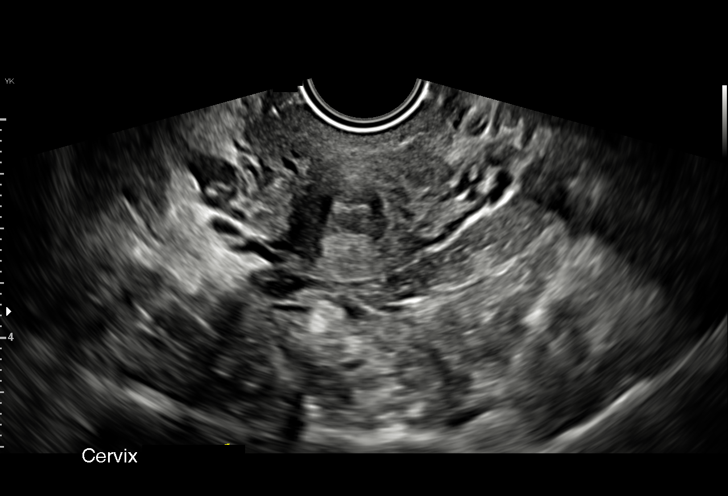
[im 29/34]
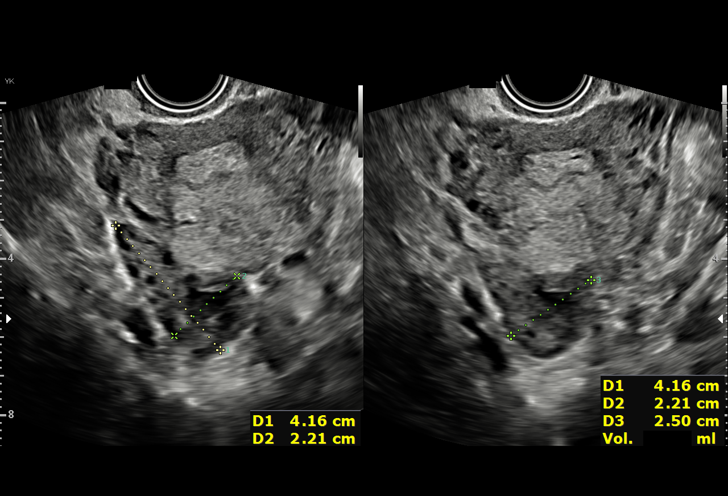
[im 31/34]
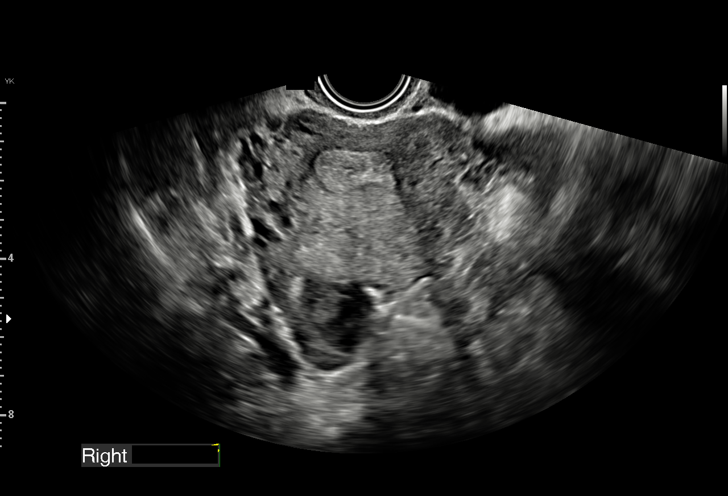
[im 34/34]
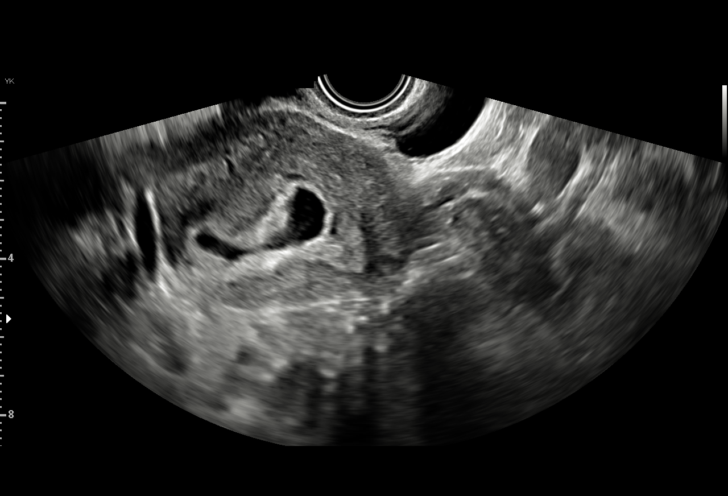

[15 of 28 positions shown; findings below may reference images not displayed]

FINDINGS: Intrauterine gestational sac: Irregular gestational sac is noted.

Yolk sac:  Present

Embryo:  Present

Cardiac Activity: Present

Heart Rate: 75 bpm

CRL:   3 mm   5 w 5 d                  US EDC: 04/07/2021

Subchorionic hemorrhage:  None

Maternal uterus/adnexae: Ovaries are within normal limits.
IMPRESSION: Early live intrauterine gestation. Mild irregularity of the
gestational sac is noted of uncertain significance. Continued
follow-up can be performed as clinically indicated.
